# Patient Record
Sex: Female | Born: 1950 | Race: White | Hispanic: No | Marital: Single | Smoking: Never smoker
Health system: Southern US, Community
[De-identification: ages and names within clinical notes are randomized; demographics above are authoritative.]

## PROBLEM LIST (undated history)

## (undated) DIAGNOSIS — E7800 Pure hypercholesterolemia, unspecified: Secondary | ICD-10-CM

## (undated) DIAGNOSIS — M1990 Unspecified osteoarthritis, unspecified site: Secondary | ICD-10-CM

## (undated) HISTORY — PX: ABDOMINAL HYSTERECTOMY: SHX81

## (undated) HISTORY — PX: BLADDER SURGERY: SHX569

## (undated) HISTORY — PX: TONSILLECTOMY: SUR1361

## (undated) HISTORY — PX: WISDOM TOOTH EXTRACTION: SHX21

## (undated) HISTORY — PX: TUBAL LIGATION: SHX77

---

## 2015-01-01 ENCOUNTER — Emergency Department (INDEPENDENT_AMBULATORY_CARE_PROVIDER_SITE_OTHER)
Admission: EM | Admit: 2015-01-01 | Discharge: 2015-01-01 | Disposition: A | Discharge status: Home / Self Care | Payer: Medicare Other | Source: Home / Self Care | Attending: Family Medicine | Admitting: Family Medicine

## 2015-01-01 ENCOUNTER — Encounter (HOSPITAL_COMMUNITY): Payer: Self-pay | Admitting: Emergency Medicine

## 2015-01-01 DIAGNOSIS — R05 Cough: Secondary | ICD-10-CM | POA: Diagnosis not present

## 2015-01-01 DIAGNOSIS — J302 Other seasonal allergic rhinitis: Secondary | ICD-10-CM

## 2015-01-01 DIAGNOSIS — R059 Cough, unspecified: Secondary | ICD-10-CM

## 2015-01-01 MED ORDER — HYDROCODONE-HOMATROPINE 5-1.5 MG/5ML PO SYRP: 5.0000 mL | ORAL_SOLUTION | Freq: Four times a day (QID) | ORAL | Status: DC | PRN

## 2015-01-01 MED ORDER — FLUTICASONE PROPIONATE 50 MCG/ACT NA SUSP: 2.0000 | Freq: Every day | NASAL | Status: DC

## 2015-01-01 NOTE — ED Notes (Signed)
C/o cough, PND onset 2 weeks Denies fevers, chills Alert, no signs of acute distress.

## 2015-01-01 NOTE — Discharge Instructions (Signed)
Thank you for coming in today. Call or go to the emergency room if you get worse, have trouble breathing, have chest pains, or palpitations.    Allergic Rhinitis Allergic rhinitis is when the mucous membranes in the nose respond to allergens. Allergens are particles in the air that cause your body to have an allergic reaction. This causes you to release allergic antibodies. Through a chain of events, these eventually cause you to release histamine into the blood stream. Although meant to protect the body, it is this release of histamine that causes your discomfort, such as frequent sneezing, congestion, and an itchy, runny nose.  CAUSES  Seasonal allergic rhinitis (hay fever) is caused by pollen allergens that may come from grasses, trees, and weeds. Year-round allergic rhinitis (perennial allergic rhinitis) is caused by allergens such as house dust mites, pet dander, and mold spores.  SYMPTOMS   Nasal stuffiness (congestion).  Itchy, runny nose with sneezing and tearing of the eyes. DIAGNOSIS  Your health care provider can help you determine the allergen or allergens that trigger your symptoms. If you and your health care provider are unable to determine the allergen, skin or blood testing may be used. TREATMENT  Allergic rhinitis does not have a cure, but it can be controlled by:  Medicines and allergy shots (immunotherapy).  Avoiding the allergen. Hay fever may often be treated with antihistamines in pill or nasal spray forms. Antihistamines block the effects of histamine. There are over-the-counter medicines that may help with nasal congestion and swelling around the eyes. Check with your health care provider before taking or giving this medicine.  If avoiding the allergen or the medicine prescribed do not work, there are many new medicines your health care provider can prescribe. Stronger medicine may be used if initial measures are ineffective. Desensitizing injections can be used if  medicine and avoidance does not work. Desensitization is when a patient is given ongoing shots until the body becomes less sensitive to the allergen. Make sure you follow up with your health care provider if problems continue. HOME CARE INSTRUCTIONS It is not possible to completely avoid allergens, but you can reduce your symptoms by taking steps to limit your exposure to them. It helps to know exactly what you are allergic to so that you can avoid your specific triggers. SEEK MEDICAL CARE IF:   You have a fever.  You develop a cough that does not stop easily (persistent).  You have shortness of breath.  You start wheezing.  Symptoms interfere with normal daily activities. Document Released: 06/13/2001 Document Revised: 09/23/2013 Document Reviewed: 05/26/2013 [facility] Patient Information 2015 [place], [place]. This information is not intended to replace advice given to you by your health care provider. Make sure you discuss any questions you have with your health care provider.  Cough, Adult  A cough is a reflex that helps clear your throat and airways. It can help heal the body or may be a reaction to an irritated airway. A cough may only last 2 or 3 weeks (acute) or may last more than 8 weeks (chronic).  CAUSES Acute cough:  Viral or bacterial infections. Chronic cough:  Infections.  Allergies.  Asthma.  Post-nasal drip.  Smoking.  Heartburn or acid reflux.  Some medicines.  Chronic lung problems (COPD).  Cancer. SYMPTOMS   Cough.  Fever.  Chest pain.  Increased breathing rate.  High-pitched whistling sound when breathing (wheezing).  Colored mucus that you cough up (sputum). TREATMENT   A bacterial cough may  be treated with antibiotic medicine.  A viral cough must run its course and will not respond to antibiotics.  Your caregiver may recommend other treatments if you have a chronic cough. HOME CARE INSTRUCTIONS   Only take over-the-counter or  prescription medicines for pain, discomfort, or fever as directed by your caregiver. Use cough suppressants only as directed by your caregiver.  Use a cold steam vaporizer or humidifier in your bedroom or home to help loosen secretions.  Sleep in a semi-upright position if your cough is worse at night.  Rest as needed.  Stop smoking if you smoke. SEEK IMMEDIATE MEDICAL CARE IF:   You have pus in your sputum.  Your cough starts to worsen.  You cannot control your cough with suppressants and are losing sleep.  You begin coughing up blood.  You have difficulty breathing.  You develop pain which is getting worse or is uncontrolled with medicine.  You have a fever. MAKE SURE YOU:   Understand these instructions.  Will watch your condition.  Will get help right away if you are not doing well or get worse. Document Released: 03/17/2011 Document Revised: 12/11/2011 Document Reviewed: 03/17/2011 [facility] Patient Information 2015 [place]. This information is not intended to replace advice given to you by your health care provider. Make sure you discuss any questions you have with your health care provider.

## 2015-01-01 NOTE — ED Provider Notes (Signed)
Ariana Ortega is a 64 y.o. female who presents to Urgent Care today for cough and postnasal drip for 2 weeks. Patient has tried some over-the-counter cold medications which have not helped. She has not tried any allergy medicines. No fevers chills nausea vomiting or diarrhea. No chest pain palpitations or shortness of breath. No wheezing. Cough is bothersome especially at nighttime.   History reviewed. No pertinent past medical history. History reviewed. No pertinent past surgical history. History  Substance Use Topics  . Smoking status: Never Smoker   . Smokeless tobacco: Not on file  . Alcohol Use: No   ROS as above Medications: No current facility-administered medications for this encounter.   Current Outpatient Prescriptions  Medication Sig Dispense Refill  . SIMVASTATIN PO Take by mouth.    . fluticasone (FLONASE) 50 MCG/ACT nasal spray Place 2 sprays into both nostrils daily. 16 g 2  . HYDROcodone-homatropine (HYCODAN) 5-1.5 MG/5ML syrup Take 5 mLs by mouth every 6 (six) hours as needed for cough. 120 mL 0   No Known Allergies   Exam:  BP 140/86 mmHg  Pulse 84  Temp(Src) 98.2 F (36.8 C) (Oral)  Resp 18  SpO2 98% Gen: Well NAD HEENT: EOMI,  MMM inflamed nasal turbinates with clear discharge present. Posterior pharynx cobblestoning. Normal tympanic membranes bilaterally. Lungs: Normal work of breathing. CTABL Heart: RRR no MRG Abd: NABS, Soft. Nondistended, Nontender Exts: Brisk capillary refill, warm and well perfused.   No results found for this or any previous visit (from the past 24 hour(s)). No results found.  Assessment and Plan: 64 y.o. female with postnasal drip with resulting cough. Nasal drip likely due to seasonal allergies. Treat with Flonase nasal spray and Zyrtec. Treat cough with Hycodan.  Discussed warning signs or symptoms. Please see discharge instructions. Patient expresses understanding.     Rodolph BongEvan S Corey, MD 01/01/15 [phone]

## 2015-06-09 DIAGNOSIS — Z01419 Encounter for gynecological examination (general) (routine) without abnormal findings: Secondary | ICD-10-CM | POA: Diagnosis not present

## 2015-10-22 DIAGNOSIS — E7800 Pure hypercholesterolemia, unspecified: Secondary | ICD-10-CM | POA: Diagnosis not present

## 2015-10-22 DIAGNOSIS — Z0000 Encounter for general adult medical examination without abnormal findings: Secondary | ICD-10-CM | POA: Diagnosis not present

## 2015-10-22 DIAGNOSIS — Z1389 Encounter for screening for other disorder: Secondary | ICD-10-CM | POA: Diagnosis not present

## 2015-10-22 DIAGNOSIS — Z23 Encounter for immunization: Secondary | ICD-10-CM | POA: Diagnosis not present

## 2015-10-22 DIAGNOSIS — Z6831 Body mass index (BMI) 31.0-31.9, adult: Secondary | ICD-10-CM | POA: Diagnosis not present

## 2015-10-26 ENCOUNTER — Other Ambulatory Visit: Payer: Self-pay | Admitting: Internal Medicine

## 2015-10-26 DIAGNOSIS — Z0000 Encounter for general adult medical examination without abnormal findings: Secondary | ICD-10-CM

## 2015-10-26 DIAGNOSIS — Z1231 Encounter for screening mammogram for malignant neoplasm of breast: Secondary | ICD-10-CM

## 2015-11-23 DIAGNOSIS — E7800 Pure hypercholesterolemia, unspecified: Secondary | ICD-10-CM | POA: Diagnosis not present

## 2015-11-24 DIAGNOSIS — Z1212 Encounter for screening for malignant neoplasm of rectum: Secondary | ICD-10-CM | POA: Diagnosis not present

## 2016-10-23 DIAGNOSIS — R8299 Other abnormal findings in urine: Secondary | ICD-10-CM | POA: Diagnosis not present

## 2016-10-23 DIAGNOSIS — E7800 Pure hypercholesterolemia, unspecified: Secondary | ICD-10-CM | POA: Diagnosis not present

## 2016-10-26 DIAGNOSIS — E668 Other obesity: Secondary | ICD-10-CM | POA: Diagnosis not present

## 2016-10-26 DIAGNOSIS — Z0000 Encounter for general adult medical examination without abnormal findings: Secondary | ICD-10-CM | POA: Diagnosis not present

## 2016-10-26 DIAGNOSIS — Z1389 Encounter for screening for other disorder: Secondary | ICD-10-CM | POA: Diagnosis not present

## 2016-10-26 DIAGNOSIS — E7800 Pure hypercholesterolemia, unspecified: Secondary | ICD-10-CM | POA: Diagnosis not present

## 2016-10-26 DIAGNOSIS — Z6832 Body mass index (BMI) 32.0-32.9, adult: Secondary | ICD-10-CM | POA: Diagnosis not present

## 2016-10-26 DIAGNOSIS — D692 Other nonthrombocytopenic purpura: Secondary | ICD-10-CM | POA: Diagnosis not present

## 2016-10-26 DIAGNOSIS — Z23 Encounter for immunization: Secondary | ICD-10-CM | POA: Diagnosis not present

## 2016-11-06 DIAGNOSIS — Z1212 Encounter for screening for malignant neoplasm of rectum: Secondary | ICD-10-CM | POA: Diagnosis not present

## 2017-09-11 ENCOUNTER — Other Ambulatory Visit: Payer: Self-pay | Admitting: Pharmacy Technician

## 2017-09-11 NOTE — Patient Outreach (Signed)
[facility] Care Management  09/11/2017  Edwena BlowCarmen Esquilin October 28, 1950 [id number]  Incoming [employer] EMMI call in reference to medication adherence. HIPAA identifiers verified and verbal consent received. Patient states she is currently taking Rosuvastatin 10 mg daily as prescribed and she does not miss any doses or have any barriers. She did state that she was use to getting a 3 month supply but lately only gets 1 month at a time. She was not sure why she this change was made or how to go about resuming with 3 months again. At her request I contacted Dr. Deneen Hartsisovec's office and left a voicemail for Morrie SheldonAshley, RN to call in a new prescription for 3 months to [facility] in [place] if Dr. Wylene Simmerisovec approves.  Incoming call from [place], the new prescription for 3 months has been sent in to [facility]. I left a HIPAA compliant voicemail informing the patient.  Daryll Brodrystal Hatim Homann, CPhT [facility] [phone]

## 2017-10-24 DIAGNOSIS — E7800 Pure hypercholesterolemia, unspecified: Secondary | ICD-10-CM | POA: Diagnosis not present

## 2017-10-24 DIAGNOSIS — R82998 Other abnormal findings in urine: Secondary | ICD-10-CM | POA: Diagnosis not present

## 2017-10-31 DIAGNOSIS — M1712 Unilateral primary osteoarthritis, left knee: Secondary | ICD-10-CM | POA: Diagnosis not present

## 2017-10-31 DIAGNOSIS — Z1389 Encounter for screening for other disorder: Secondary | ICD-10-CM | POA: Diagnosis not present

## 2017-10-31 DIAGNOSIS — Z0000 Encounter for general adult medical examination without abnormal findings: Secondary | ICD-10-CM | POA: Diagnosis not present

## 2017-10-31 DIAGNOSIS — Z6831 Body mass index (BMI) 31.0-31.9, adult: Secondary | ICD-10-CM | POA: Diagnosis not present

## 2017-10-31 DIAGNOSIS — E7800 Pure hypercholesterolemia, unspecified: Secondary | ICD-10-CM | POA: Diagnosis not present

## 2017-10-31 DIAGNOSIS — M1711 Unilateral primary osteoarthritis, right knee: Secondary | ICD-10-CM | POA: Diagnosis not present

## 2017-10-31 DIAGNOSIS — E668 Other obesity: Secondary | ICD-10-CM | POA: Diagnosis not present

## 2017-10-31 DIAGNOSIS — D692 Other nonthrombocytopenic purpura: Secondary | ICD-10-CM | POA: Diagnosis not present

## 2017-11-01 ENCOUNTER — Other Ambulatory Visit: Payer: Self-pay | Admitting: Internal Medicine

## 2017-11-01 DIAGNOSIS — Z23 Encounter for immunization: Secondary | ICD-10-CM | POA: Diagnosis not present

## 2017-11-01 DIAGNOSIS — Z13820 Encounter for screening for osteoporosis: Secondary | ICD-10-CM | POA: Diagnosis not present

## 2017-11-01 DIAGNOSIS — Z1231 Encounter for screening mammogram for malignant neoplasm of breast: Secondary | ICD-10-CM

## 2017-11-06 DIAGNOSIS — Z1212 Encounter for screening for malignant neoplasm of rectum: Secondary | ICD-10-CM | POA: Diagnosis not present

## 2018-01-09 ENCOUNTER — Ambulatory Visit
Admission: RE | Admit: 2018-01-09 | Discharge: 2018-01-09 | Disposition: A | Discharge status: Home / Self Care | Payer: PPO | Source: Ambulatory Visit | Attending: Internal Medicine | Admitting: Internal Medicine

## 2018-01-09 DIAGNOSIS — Z1231 Encounter for screening mammogram for malignant neoplasm of breast: Secondary | ICD-10-CM

## 2018-03-22 DIAGNOSIS — N390 Urinary tract infection, site not specified: Secondary | ICD-10-CM | POA: Diagnosis not present

## 2018-03-22 DIAGNOSIS — R300 Dysuria: Secondary | ICD-10-CM | POA: Diagnosis not present

## 2018-06-18 DIAGNOSIS — M25552 Pain in left hip: Secondary | ICD-10-CM | POA: Diagnosis not present

## 2018-06-18 DIAGNOSIS — Z6831 Body mass index (BMI) 31.0-31.9, adult: Secondary | ICD-10-CM | POA: Diagnosis not present

## 2018-06-28 DIAGNOSIS — M25552 Pain in left hip: Secondary | ICD-10-CM | POA: Diagnosis not present

## 2018-06-28 DIAGNOSIS — M1612 Unilateral primary osteoarthritis, left hip: Secondary | ICD-10-CM | POA: Diagnosis not present

## 2018-08-12 NOTE — H&P (Signed)
TOTAL HIP ADMISSION H&P  Patient is admitted for left total hip arthroplasty, anterior approach.  Subjective:  Chief Complaint:    Left hip primary OA / pain  HPI: Ariana Ortega, 67 y.o. female, has a history of pain and functional disability in the left hip(s) due to arthritis and patient has failed non-surgical conservative treatments for greater than 12 weeks to include NSAID's and/or analgesics and activity modification.  Onset of symptoms was gradual starting years ago with gradually worsening course since that time.The patient noted no past surgery on the left hip(s).  Patient currently rates pain in the left hip at 10 out of 10 with activity. Patient has night pain, worsening of pain with activity and weight bearing, trendelenberg gait, pain that interfers with activities of daily living and pain with passive range of motion. Patient has evidence of periarticular osteophytes and joint space narrowing by imaging studies. This condition presents safety issues increasing the risk of falls.   There is no current active infection.  Risks, benefits and expectations were discussed with the patient.  Risks including but not limited to the risk of anesthesia, blood clots, nerve damage, blood vessel damage, failure of the prosthesis, infection and up to and including death.  Patient understand the risks, benefits and expectations and wishes to proceed with surgery.   PCP: Gaspar Garbe, MD  D/C Plans:       Home  Post-op Meds:       No Rx given  Tranexamic Acid:      To be given - IV   Decadron:      Is to be given  FYI:      ASA  Norco (ok per pt)  DME:   Rx given for - RW   PT:   No PT    Past Medical History:  Diagnosis Date  . Arthritis   . Elevated cholesterol     Past Surgical History:  Procedure Laterality Date  . ABDOMINAL HYSTERECTOMY    . BLADDER SURGERY     with mesh   . TONSILLECTOMY  age 45   . TUBAL LIGATION  50 years ago  . WISDOM TOOTH EXTRACTION      No  current facility-administered medications for this encounter.    Current Outpatient Medications  Medication Sig Dispense Refill Last Dose  . acetaminophen (TYLENOL) 500 MG tablet Take 500-1,000 mg by mouth every 6 (six) hours as needed (for pain).     Marland Kitchen aspirin EC 81 MG tablet Take 81 mg by mouth daily.     . meloxicam (MOBIC) 7.5 MG tablet Take 7.5 mg by mouth daily.     . rosuvastatin (CRESTOR) 10 MG tablet Take 10 mg by mouth daily.      Allergies  Allergen Reactions  . Codeine Nausea And Vomiting    Nausea , vomiting, dizziness      Social History   Tobacco Use  . Smoking status: Never Smoker  . Smokeless tobacco: Never Used  Substance Use Topics  . Alcohol use: Yes    Comment: occ       Review of Systems  Constitutional: Negative.   HENT: Negative.   Eyes: Negative.   Respiratory: Negative.   Cardiovascular: Negative.   Gastrointestinal: Negative.   Genitourinary: Negative.   Musculoskeletal: Positive for joint pain.  Skin: Negative.   Neurological: Negative.   Endo/Heme/Allergies: Negative.   Psychiatric/Behavioral: Negative.     Objective:  Physical Exam  Constitutional: She is oriented to person,  place, and time. She appears well-developed.  HENT:  Head: Normocephalic.  Eyes: Pupils are equal, round, and reactive to light.  Neck: Neck supple. No JVD present. No tracheal deviation present. No thyromegaly present.  Cardiovascular: Normal rate, regular rhythm and intact distal pulses.  Respiratory: Effort normal and breath sounds normal. No respiratory distress. She has no wheezes.  GI: Soft. There is no tenderness. There is no guarding.  Musculoskeletal:       Left hip: She exhibits decreased range of motion, decreased strength, tenderness and bony tenderness. She exhibits no swelling, no deformity and no laceration.  Lymphadenopathy:    She has no cervical adenopathy.  Neurological: She is alert and oriented to person, place, and time.  Skin: Skin is  warm and dry.  Psychiatric: She has a normal mood and affect.      Imaging Review Plain radiographs demonstrate severe degenerative joint disease of the left hip. The bone quality appears to be good for age and reported activity level.    Preoperative templating of the joint replacement has been completed, documented, and submitted to the Operating Room personnel in order to optimize intra-operative equipment management.     Assessment/Plan:  End stage arthritis, left hip  The patient history, physical examination, clinical judgement of the provider and imaging studies are consistent with end stage degenerative joint disease of the left hip and total hip arthroplasty is deemed medically necessary. The treatment options including medical management, injection therapy, arthroscopy and arthroplasty were discussed at length. The risks and benefits of total hip arthroplasty were presented and reviewed. The risks due to aseptic loosening, infection, stiffness, dislocation/subluxation,  thromboembolic complications and other imponderables were discussed.  The patient acknowledged the explanation, agreed to proceed with the plan and consent was signed. Patient is being admitted for inpatient treatment for surgery, pain control, PT, OT, prophylactic antibiotics, VTE prophylaxis, progressive ambulation and ADL's and discharge planning.The patient is planning to be discharged home.     Ariana Ortega Ariana Easton   PA-C  09/02/2018, 7:49 AM

## 2018-08-14 DIAGNOSIS — M1612 Unilateral primary osteoarthritis, left hip: Secondary | ICD-10-CM | POA: Diagnosis not present

## 2018-08-27 NOTE — Patient Instructions (Signed)
Ariana BillingsCarmen E Ortega  08/27/2018   Your procedure is scheduled on: 09-03-18   Report [facility]    Report to admitting at 9:00AM    Call this number if you have problems the morning of surgery [phone]     Remember: Do not eat food or drink liquids :After Midnight. BRUSH YOUR TEETH MORNING OF SURGERY AND RINSE YOUR MOUTH OUT, NO CHEWING GUM CANDY OR MINTS.      Take these medicines the morning of surgery with A SIP OF WATER: rosuvastatin, tylenol if needed                                You may not have any metal on your body including hair pins and              piercings  Do not wear jewelry, make-up, lotions, powders or perfumes, deodorant             Do not wear nail polish.  Do not shave  48 hours prior to surgery.     Do not bring valuables to the hospital. [facility] IS NOT             RESPONSIBLE   FOR VALUABLES.  Contacts, dentures or bridgework may not be worn into surgery.  Leave suitcase in the car. After surgery it may be brought to your room.                   Please read over the following fact sheets you were given: _____________________________________________________________________             [facility] - Preparing for Surgery Before surgery, you can play an important role.  Because skin is not sterile, your skin needs to be as free of germs as possible.  You can reduce the number of germs on your skin by washing with CHG (chlorahexidine gluconate) soap before surgery.  CHG is an antiseptic cleaner which kills germs and bonds with the skin to continue killing germs even after washing. Please DO NOT use if you have an allergy to CHG or antibacterial soaps.  If your skin becomes reddened/irritated stop using the CHG and inform your nurse when you arrive at Short Stay. Do not shave (including legs and underarms) for at least 48 hours prior to the first CHG shower.  You may shave your face/neck. Please follow these  instructions carefully:  1.  Shower with CHG Soap the night before surgery and the  morning of Surgery.  2.  If you choose to wash your hair, wash your hair first as usual with your  normal  shampoo.  3.  After you shampoo, rinse your hair and body thoroughly to remove the  shampoo.                           4.  Use CHG as you would any other liquid soap.  You can apply chg directly  to the skin and wash                       Gently with a scrungie or clean washcloth.  5.  Apply the CHG Soap to your body ONLY FROM THE NECK DOWN.   Do not use on face/ open  Wound or open sores. Avoid contact with eyes, ears mouth and genitals (private parts).                       Wash face,  Genitals (private parts) with your normal soap.             6.  Wash thoroughly, paying special attention to the area where your surgery  will be performed.  7.  Thoroughly rinse your body with warm water from the neck down.  8.  DO NOT shower/wash with your normal soap after using and rinsing off  the CHG Soap.                9.  Pat yourself dry with a clean towel.            10.  Wear clean pajamas.            11.  Place clean sheets on your bed the night of your first shower and do not  sleep with pets. Day of Surgery : Do not apply any lotions/deodorants the morning of surgery.  Please wear clean clothes to the hospital/surgery center.  FAILURE TO FOLLOW THESE INSTRUCTIONS MAY RESULT IN THE CANCELLATION OF YOUR SURGERY PATIENT SIGNATURE_________________________________  NURSE SIGNATURE__________________________________  ________________________________________________________________________   Adam Phenix  An incentive spirometer is a tool that can help keep your lungs clear and active. This tool measures how well you are filling your lungs with each breath. Taking long deep breaths may help reverse or decrease the chance of developing breathing (pulmonary) problems (especially  infection) following:  A long period of time when you are unable to move or be active. BEFORE THE PROCEDURE   If the spirometer includes an indicator to show your best effort, your nurse or respiratory therapist will set it to a desired goal.  If possible, sit up straight or lean slightly forward. Try not to slouch.  Hold the incentive spirometer in an upright position. INSTRUCTIONS FOR USE  1. Sit on the edge of your bed if possible, or sit up as far as you can in bed or on a chair. 2. Hold the incentive spirometer in an upright position. 3. Breathe out normally. 4. Place the mouthpiece in your mouth and seal your lips tightly around it. 5. Breathe in slowly and as deeply as possible, raising the piston or the ball toward the top of the column. 6. Hold your breath for 3-5 seconds or for as long as possible. Allow the piston or ball to fall to the bottom of the column. 7. Remove the mouthpiece from your mouth and breathe out normally. 8. Rest for a few seconds and repeat Steps 1 through 7 at least 10 times every 1-2 hours when you are awake. Take your time and take a few normal breaths between deep breaths. 9. The spirometer may include an indicator to show your best effort. Use the indicator as a goal to work toward during each repetition. 10. After each set of 10 deep breaths, practice coughing to be sure your lungs are clear. If you have an incision (the cut made at the time of surgery), support your incision when coughing by placing a pillow or rolled up towels firmly against it. Once you are able to get out of bed, walk around indoors and cough well. You may stop using the incentive spirometer when instructed by your caregiver.  RISKS AND COMPLICATIONS  Take your time so you do not get  dizzy or light-headed.  If you are in pain, you may need to take or ask for pain medication before doing incentive spirometry. It is harder to take a deep breath if you are having pain. AFTER  USE  Rest and breathe slowly and easily.  It can be helpful to keep track of a log of your progress. Your caregiver can provide you with a simple table to help with this. If you are using the spirometer at home, follow these instructions: [place] IF:   You are having difficultly using the spirometer.  You have trouble using the spirometer as often as instructed.  Your pain medication is not giving enough relief while using the spirometer.  You develop fever of 100.5 F (38.1 C) or higher. SEEK IMMEDIATE MEDICAL CARE IF:   You cough up bloody sputum that had not been present before.  You develop fever of 102 F (38.9 C) or greater.  You develop worsening pain at or near the incision site. MAKE SURE YOU:   Understand these instructions.  Will watch your condition.  Will get help right away if you are not doing well or get worse. Document Released: 01/29/2007 Document Revised: 12/11/2011 Document Reviewed: 04/01/2007 ExitCare Patient Information 2014 ExitCare, [place].   ________________________________________________________________________  WHAT IS A BLOOD TRANSFUSION? Blood Transfusion Information  A transfusion is the replacement of blood or some of its parts. Blood is made up of multiple cells which provide different functions.  Red blood cells carry oxygen and are used for blood loss replacement.  White blood cells fight against infection.  Platelets control bleeding.  Plasma helps clot blood.  Other blood products are available for specialized needs, such as hemophilia or other clotting disorders. BEFORE THE TRANSFUSION  Who gives blood for transfusions?   Healthy volunteers who are fully evaluated to make sure their blood is safe. This is blood bank blood. Transfusion therapy is the safest it has ever been in the practice of medicine. Before blood is taken from a donor, a complete history is taken to make sure that person has no history of diseases  nor engages in risky social behavior (examples are intravenous drug use or sexual activity with multiple partners). The donor's travel history is screened to minimize risk of transmitting infections, such as malaria. The donated blood is tested for signs of infectious diseases, such as HIV and hepatitis. The blood is then tested to be sure it is compatible with you in order to minimize the chance of a transfusion reaction. If you or a relative donates blood, this is often done in anticipation of surgery and is not appropriate for emergency situations. It takes many days to process the donated blood. RISKS AND COMPLICATIONS Although transfusion therapy is very safe and saves many lives, the main dangers of transfusion include:   Getting an infectious disease.  Developing a transfusion reaction. This is an allergic reaction to something in the blood you were given. Every precaution is taken to prevent this. The decision to have a blood transfusion has been considered carefully by your caregiver before blood is given. Blood is not given unless the benefits outweigh the risks. AFTER THE TRANSFUSION  Right after receiving a blood transfusion, you will usually feel much better and more energetic. This is especially true if your red blood cells have gotten low (anemic). The transfusion raises the level of the red blood cells which carry oxygen, and this usually causes an energy increase.  The nurse administering the transfusion will  monitor you carefully for complications. HOME CARE INSTRUCTIONS  No special instructions are needed after a transfusion. You may find your energy is better. Speak with your caregiver about any limitations on activity for underlying diseases you may have. SEEK MEDICAL CARE IF:   Your condition is not improving after your transfusion.  You develop redness or irritation at the intravenous (IV) site. SEEK IMMEDIATE MEDICAL CARE IF:  Any of the following symptoms occur over the  next 12 hours:  Shaking chills.  You have a temperature by mouth above 102 F (38.9 C), not controlled by medicine.  Chest, back, or muscle pain.  People around you feel you are not acting correctly or are confused.  Shortness of breath or difficulty breathing.  Dizziness and fainting.  You get a rash or develop hives.  You have a decrease in urine output.  Your urine turns a dark color or changes to pink, red, or brown. Any of the following symptoms occur over the next 10 days:  You have a temperature by mouth above 102 F (38.9 C), not controlled by medicine.  Shortness of breath.  Weakness after normal activity.  The white part of the eye turns yellow (jaundice).  You have a decrease in the amount of urine or are urinating less often.  Your urine turns a dark color or changes to pink, red, or brown. Document Released: 09/15/2000 Document Revised: 12/11/2011 Document Reviewed: 05/04/2008 [facility] Patient Information 2014 [place], [place].  _______________________________________________________________________

## 2018-08-28 ENCOUNTER — Encounter (HOSPITAL_COMMUNITY): Payer: Self-pay

## 2018-08-28 ENCOUNTER — Other Ambulatory Visit: Payer: Self-pay

## 2018-08-28 ENCOUNTER — Encounter (HOSPITAL_COMMUNITY)
Admission: RE | Admit: 2018-08-28 | Discharge: 2018-08-28 | Disposition: A | Discharge status: Home / Self Care | Payer: PPO | Source: Ambulatory Visit | Attending: Orthopedic Surgery | Admitting: Orthopedic Surgery

## 2018-08-28 DIAGNOSIS — Z01818 Encounter for other preprocedural examination: Secondary | ICD-10-CM | POA: Insufficient documentation

## 2018-08-28 DIAGNOSIS — M1612 Unilateral primary osteoarthritis, left hip: Secondary | ICD-10-CM | POA: Insufficient documentation

## 2018-08-28 HISTORY — DX: Pure hypercholesterolemia, unspecified: E78.00

## 2018-08-28 HISTORY — DX: Unspecified osteoarthritis, unspecified site: M19.90

## 2018-08-28 LAB — CBC
HEMATOCRIT: 41.5 % (ref 36.0–46.0)
Hemoglobin: 13.5 g/dL (ref 12.0–15.0)
MCH: 31.8 pg (ref 26.0–34.0)
MCHC: 32.5 g/dL (ref 30.0–36.0)
MCV: 97.9 fL (ref 80.0–100.0)
NRBC: 0 % (ref 0.0–0.2)
Platelets: 291 10*3/uL (ref 150–400)
RBC: 4.24 MIL/uL (ref 3.87–5.11)
RDW: 12.6 % (ref 11.5–15.5)
WBC: 7.8 10*3/uL (ref 4.0–10.5)

## 2018-08-28 LAB — SURGICAL PCR SCREEN
MRSA, PCR: NEGATIVE
STAPHYLOCOCCUS AUREUS: NEGATIVE

## 2018-08-28 LAB — ABO/RH: ABO/RH(D): O POS

## 2018-09-03 ENCOUNTER — Encounter (HOSPITAL_COMMUNITY): Payer: Self-pay | Admitting: *Deleted

## 2018-09-03 ENCOUNTER — Inpatient Hospital Stay (HOSPITAL_COMMUNITY): Payer: PPO

## 2018-09-03 ENCOUNTER — Inpatient Hospital Stay (HOSPITAL_COMMUNITY): Payer: PPO | Admitting: Anesthesiology

## 2018-09-03 ENCOUNTER — Inpatient Hospital Stay (HOSPITAL_COMMUNITY)
Admission: RE | Admit: 2018-09-03 | Discharge: 2018-09-04 | DRG: 470 | Disposition: A | Discharge status: Home / Self Care | Payer: PPO | Attending: Orthopedic Surgery | Admitting: Orthopedic Surgery

## 2018-09-03 ENCOUNTER — Encounter (HOSPITAL_COMMUNITY)
Admission: RE | Disposition: A | Discharge status: Home / Self Care | Payer: Self-pay | Source: Home / Self Care | Attending: Orthopedic Surgery

## 2018-09-03 ENCOUNTER — Other Ambulatory Visit: Payer: Self-pay

## 2018-09-03 DIAGNOSIS — E663 Overweight: Secondary | ICD-10-CM | POA: Diagnosis present

## 2018-09-03 DIAGNOSIS — Z419 Encounter for procedure for purposes other than remedying health state, unspecified: Secondary | ICD-10-CM

## 2018-09-03 DIAGNOSIS — Z90710 Acquired absence of both cervix and uterus: Secondary | ICD-10-CM

## 2018-09-03 DIAGNOSIS — E7800 Pure hypercholesterolemia, unspecified: Secondary | ICD-10-CM | POA: Diagnosis not present

## 2018-09-03 DIAGNOSIS — M1612 Unilateral primary osteoarthritis, left hip: Secondary | ICD-10-CM | POA: Diagnosis not present

## 2018-09-03 DIAGNOSIS — Z471 Aftercare following joint replacement surgery: Secondary | ICD-10-CM | POA: Diagnosis not present

## 2018-09-03 DIAGNOSIS — Z96642 Presence of left artificial hip joint: Secondary | ICD-10-CM

## 2018-09-03 DIAGNOSIS — Z7982 Long term (current) use of aspirin: Secondary | ICD-10-CM

## 2018-09-03 DIAGNOSIS — Z79899 Other long term (current) drug therapy: Secondary | ICD-10-CM | POA: Diagnosis not present

## 2018-09-03 DIAGNOSIS — Z885 Allergy status to narcotic agent status: Secondary | ICD-10-CM

## 2018-09-03 DIAGNOSIS — Z96649 Presence of unspecified artificial hip joint: Secondary | ICD-10-CM

## 2018-09-03 HISTORY — PX: TOTAL HIP ARTHROPLASTY: SHX124

## 2018-09-03 LAB — TYPE AND SCREEN
ABO/RH(D): O POS
ANTIBODY SCREEN: NEGATIVE

## 2018-09-03 SURGERY — ARTHROPLASTY, HIP, TOTAL, ANTERIOR APPROACH
Anesthesia: Spinal | Site: Hip | Laterality: Left

## 2018-09-03 MED ORDER — DEXAMETHASONE SODIUM PHOSPHATE 10 MG/ML IJ SOLN: 10.0000 mg | Freq: Once | INTRAMUSCULAR | Status: DC

## 2018-09-03 MED ORDER — HYDROCODONE-ACETAMINOPHEN 7.5-325 MG PO TABS
1.0000 | ORAL_TABLET | ORAL | Status: DC | PRN
  Administered 2018-09-03 (×2): 1 via ORAL
  Administered 2018-09-03: 2 via ORAL
  Administered 2018-09-03: 1 via ORAL
  Filled 2018-09-03 (×2): qty 1
  Filled 2018-09-03: qty 2
  Filled 2018-09-03: qty 1

## 2018-09-03 MED ORDER — PHENYLEPHRINE 40 MCG/ML (10ML) SYRINGE FOR IV PUSH (FOR BLOOD PRESSURE SUPPORT)
PREFILLED_SYRINGE | INTRAVENOUS | Status: AC
  Filled 2018-09-03: qty 10

## 2018-09-03 MED ORDER — FERROUS SULFATE 325 (65 FE) MG PO TABS
325.0000 mg | ORAL_TABLET | Freq: Three times a day (TID) | ORAL | Status: DC
  Filled 2018-09-03: qty 1

## 2018-09-03 MED ORDER — MAGNESIUM CITRATE PO SOLN: 1.0000 | Freq: Once | ORAL | Status: DC | PRN

## 2018-09-03 MED ORDER — TRANEXAMIC ACID-NACL 1000-0.7 MG/100ML-% IV SOLN
1000.0000 mg | INTRAVENOUS | Status: AC
  Administered 2018-09-03: 1000 mg via INTRAVENOUS
  Filled 2018-09-03: qty 100

## 2018-09-03 MED ORDER — PROPOFOL 10 MG/ML IV BOLUS
INTRAVENOUS | Status: AC
  Filled 2018-09-03: qty 60

## 2018-09-03 MED ORDER — METHOCARBAMOL 500 MG PO TABS
500.0000 mg | ORAL_TABLET | Freq: Four times a day (QID) | ORAL | Status: DC | PRN
  Administered 2018-09-03: 500 mg via ORAL
  Filled 2018-09-03: qty 1

## 2018-09-03 MED ORDER — HYDROCODONE-ACETAMINOPHEN 7.5-325 MG PO TABS: 1.0000 | ORAL_TABLET | ORAL | 0 refills | Status: AC | PRN

## 2018-09-03 MED ORDER — ASPIRIN 81 MG PO CHEW: 81.0000 mg | CHEWABLE_TABLET | Freq: Two times a day (BID) | ORAL | 0 refills | Status: AC

## 2018-09-03 MED ORDER — ONDANSETRON HCL 4 MG/2ML IJ SOLN
INTRAMUSCULAR | Status: AC
  Filled 2018-09-03: qty 2

## 2018-09-03 MED ORDER — HYDROMORPHONE HCL 1 MG/ML IJ SOLN: 0.5000 mg | INTRAMUSCULAR | Status: DC | PRN

## 2018-09-03 MED ORDER — BISACODYL 10 MG RE SUPP: 10.0000 mg | Freq: Every day | RECTAL | Status: DC | PRN

## 2018-09-03 MED ORDER — DIPHENHYDRAMINE HCL 12.5 MG/5ML PO ELIX: 12.5000 mg | ORAL_SOLUTION | ORAL | Status: DC | PRN

## 2018-09-03 MED ORDER — CELECOXIB 200 MG PO CAPS
200.0000 mg | ORAL_CAPSULE | Freq: Two times a day (BID) | ORAL | Status: DC
  Administered 2018-09-03 – 2018-09-04 (×3): 200 mg via ORAL
  Filled 2018-09-03 (×3): qty 1

## 2018-09-03 MED ORDER — CEFAZOLIN SODIUM-DEXTROSE 2-4 GM/100ML-% IV SOLN
2.0000 g | INTRAVENOUS | Status: AC
  Administered 2018-09-03: 2 g via INTRAVENOUS
  Filled 2018-09-03: qty 100

## 2018-09-03 MED ORDER — HYDROMORPHONE HCL 1 MG/ML IJ SOLN: 0.2500 mg | INTRAMUSCULAR | Status: DC | PRN

## 2018-09-03 MED ORDER — PHENOL 1.4 % MT LIQD: 1.0000 | OROMUCOSAL | Status: DC | PRN

## 2018-09-03 MED ORDER — METHOCARBAMOL 500 MG PO TABS: 500.0000 mg | ORAL_TABLET | Freq: Four times a day (QID) | ORAL | 0 refills | Status: DC | PRN

## 2018-09-03 MED ORDER — DOCUSATE SODIUM 100 MG PO CAPS
100.0000 mg | ORAL_CAPSULE | Freq: Two times a day (BID) | ORAL | Status: DC
  Filled 2018-09-03: qty 1

## 2018-09-03 MED ORDER — LACTATED RINGERS IV SOLN
INTRAVENOUS | Status: DC
  Administered 2018-09-03 (×2): via INTRAVENOUS

## 2018-09-03 MED ORDER — DOCUSATE SODIUM 100 MG PO CAPS: 100.0000 mg | ORAL_CAPSULE | Freq: Two times a day (BID) | ORAL | 0 refills | Status: AC

## 2018-09-03 MED ORDER — DEXAMETHASONE SODIUM PHOSPHATE 10 MG/ML IJ SOLN
INTRAMUSCULAR | Status: DC | PRN
  Administered 2018-09-03: 10 mg via INTRAVENOUS

## 2018-09-03 MED ORDER — PHENYLEPHRINE HCL 10 MG/ML IJ SOLN
INTRAMUSCULAR | Status: AC
  Filled 2018-09-03: qty 1

## 2018-09-03 MED ORDER — DEXAMETHASONE SODIUM PHOSPHATE 10 MG/ML IJ SOLN
INTRAMUSCULAR | Status: AC
  Filled 2018-09-03: qty 1

## 2018-09-03 MED ORDER — TRANEXAMIC ACID-NACL 1000-0.7 MG/100ML-% IV SOLN
1000.0000 mg | Freq: Once | INTRAVENOUS | Status: AC
  Administered 2018-09-03: 1000 mg via INTRAVENOUS
  Filled 2018-09-03: qty 100

## 2018-09-03 MED ORDER — ONDANSETRON HCL 4 MG/2ML IJ SOLN
INTRAMUSCULAR | Status: DC | PRN
  Administered 2018-09-03: 4 mg via INTRAVENOUS

## 2018-09-03 MED ORDER — ONDANSETRON HCL 4 MG PO TABS: 4.0000 mg | ORAL_TABLET | Freq: Four times a day (QID) | ORAL | Status: DC | PRN

## 2018-09-03 MED ORDER — ONDANSETRON HCL 4 MG/2ML IJ SOLN: 4.0000 mg | Freq: Four times a day (QID) | INTRAMUSCULAR | Status: DC | PRN

## 2018-09-03 MED ORDER — PROMETHAZINE HCL 25 MG/ML IJ SOLN: 6.2500 mg | INTRAMUSCULAR | Status: DC | PRN

## 2018-09-03 MED ORDER — MENTHOL 3 MG MT LOZG: 1.0000 | LOZENGE | OROMUCOSAL | Status: DC | PRN

## 2018-09-03 MED ORDER — MIDAZOLAM HCL 2 MG/2ML IJ SOLN
INTRAMUSCULAR | Status: AC
  Filled 2018-09-03: qty 2

## 2018-09-03 MED ORDER — ROSUVASTATIN CALCIUM 10 MG PO TABS: 10.0000 mg | ORAL_TABLET | Freq: Every day | ORAL | Status: DC

## 2018-09-03 MED ORDER — ALUM & MAG HYDROXIDE-SIMETH 200-200-20 MG/5ML PO SUSP: 15.0000 mL | ORAL | Status: DC | PRN

## 2018-09-03 MED ORDER — CEFAZOLIN SODIUM-DEXTROSE 2-4 GM/100ML-% IV SOLN
2.0000 g | Freq: Four times a day (QID) | INTRAVENOUS | Status: AC
  Administered 2018-09-03 (×2): 2 g via INTRAVENOUS
  Filled 2018-09-03 (×2): qty 100

## 2018-09-03 MED ORDER — METHOCARBAMOL 500 MG IVPB - SIMPLE MED
500.0000 mg | Freq: Four times a day (QID) | INTRAVENOUS | Status: DC | PRN
  Filled 2018-09-03: qty 50

## 2018-09-03 MED ORDER — CHLORHEXIDINE GLUCONATE 4 % EX LIQD: 60.0000 mL | Freq: Once | CUTANEOUS | Status: DC

## 2018-09-03 MED ORDER — SODIUM CHLORIDE 0.9 % IR SOLN
Status: DC | PRN
  Administered 2018-09-03: 1000 mL

## 2018-09-03 MED ORDER — POLYETHYLENE GLYCOL 3350 17 G PO PACK: 17.0000 g | PACK | Freq: Two times a day (BID) | ORAL | 0 refills | Status: AC

## 2018-09-03 MED ORDER — BUPIVACAINE IN DEXTROSE 0.75-8.25 % IT SOLN
INTRATHECAL | Status: DC | PRN
  Administered 2018-09-03: 1.8 mL via INTRATHECAL

## 2018-09-03 MED ORDER — SODIUM CHLORIDE 0.9 % IV SOLN
INTRAVENOUS | Status: DC
  Administered 2018-09-03 – 2018-09-04 (×2): via INTRAVENOUS

## 2018-09-03 MED ORDER — POLYETHYLENE GLYCOL 3350 17 G PO PACK
17.0000 g | PACK | Freq: Two times a day (BID) | ORAL | Status: DC
  Filled 2018-09-03 (×2): qty 1

## 2018-09-03 MED ORDER — STERILE WATER FOR IRRIGATION IR SOLN
Status: DC | PRN
  Administered 2018-09-03: 2000 mL

## 2018-09-03 MED ORDER — DEXAMETHASONE SODIUM PHOSPHATE 10 MG/ML IJ SOLN
10.0000 mg | Freq: Once | INTRAMUSCULAR | Status: AC
  Administered 2018-09-04: 10 mg via INTRAVENOUS
  Filled 2018-09-03: qty 1

## 2018-09-03 MED ORDER — PHENYLEPHRINE 40 MCG/ML (10ML) SYRINGE FOR IV PUSH (FOR BLOOD PRESSURE SUPPORT)
PREFILLED_SYRINGE | INTRAVENOUS | Status: DC | PRN
  Administered 2018-09-03 (×2): 80 ug via INTRAVENOUS

## 2018-09-03 MED ORDER — FERROUS SULFATE 325 (65 FE) MG PO TABS: 325.0000 mg | ORAL_TABLET | Freq: Three times a day (TID) | ORAL | 3 refills | Status: AC

## 2018-09-03 MED ORDER — MIDAZOLAM HCL 5 MG/5ML IJ SOLN
INTRAMUSCULAR | Status: DC | PRN
  Administered 2018-09-03: 2 mg via INTRAVENOUS

## 2018-09-03 MED ORDER — ASPIRIN 81 MG PO CHEW
81.0000 mg | CHEWABLE_TABLET | Freq: Two times a day (BID) | ORAL | Status: DC
  Administered 2018-09-03 – 2018-09-04 (×2): 81 mg via ORAL
  Filled 2018-09-03 (×2): qty 1

## 2018-09-03 MED ORDER — PROPOFOL 500 MG/50ML IV EMUL
INTRAVENOUS | Status: DC | PRN
  Administered 2018-09-03: 75 ug/kg/min via INTRAVENOUS

## 2018-09-03 MED ORDER — METOCLOPRAMIDE HCL 5 MG/ML IJ SOLN: 5.0000 mg | Freq: Three times a day (TID) | INTRAMUSCULAR | Status: DC | PRN

## 2018-09-03 MED ORDER — FENTANYL CITRATE (PF) 100 MCG/2ML IJ SOLN
INTRAMUSCULAR | Status: AC
  Filled 2018-09-03: qty 2

## 2018-09-03 MED ORDER — METOCLOPRAMIDE HCL 5 MG PO TABS: 5.0000 mg | ORAL_TABLET | Freq: Three times a day (TID) | ORAL | Status: DC | PRN

## 2018-09-03 MED ORDER — FENTANYL CITRATE (PF) 100 MCG/2ML IJ SOLN
INTRAMUSCULAR | Status: DC | PRN
  Administered 2018-09-03: 100 ug via INTRAVENOUS

## 2018-09-03 MED ORDER — SODIUM CHLORIDE 0.9 % IV SOLN
INTRAVENOUS | Status: DC | PRN
  Administered 2018-09-03: 40 ug/min via INTRAVENOUS

## 2018-09-03 MED ORDER — ACETAMINOPHEN 325 MG PO TABS: 325.0000 mg | ORAL_TABLET | Freq: Four times a day (QID) | ORAL | Status: DC | PRN

## 2018-09-03 MED ORDER — HYDROCODONE-ACETAMINOPHEN 5-325 MG PO TABS: 1.0000 | ORAL_TABLET | ORAL | Status: DC | PRN

## 2018-09-03 SURGICAL SUPPLY — 43 items
BAG DECANTER FOR FLEXI CONT (MISCELLANEOUS) IMPLANT
BAG ZIPLOCK 12X15 (MISCELLANEOUS) IMPLANT
BLADE SAG 18X100X1.27 (BLADE) ×3 IMPLANT
COVER PERINEAL POST (MISCELLANEOUS) ×3 IMPLANT
COVER SURGICAL LIGHT HANDLE (MISCELLANEOUS) ×3 IMPLANT
COVER WAND RF STERILE (DRAPES) IMPLANT
CUP ACET PINNACLE SECTR 50MM (Hips) ×1 IMPLANT
DERMABOND ADVANCED (GAUZE/BANDAGES/DRESSINGS) ×2
DERMABOND ADVANCED .7 DNX12 (GAUZE/BANDAGES/DRESSINGS) ×1 IMPLANT
DRAPE STERI IOBAN 125X83 (DRAPES) ×3 IMPLANT
DRAPE U-SHAPE 47X51 STRL (DRAPES) ×6 IMPLANT
DRESSING AQUACEL AG SP 3.5X10 (GAUZE/BANDAGES/DRESSINGS) ×1 IMPLANT
DRSG AQUACEL AG ADV 3.5X10 (GAUZE/BANDAGES/DRESSINGS) ×3 IMPLANT
DRSG AQUACEL AG SP 3.5X10 (GAUZE/BANDAGES/DRESSINGS) ×3
DURAPREP 26ML APPLICATOR (WOUND CARE) ×3 IMPLANT
ELECT REM PT RETURN 15FT ADLT (MISCELLANEOUS) ×3 IMPLANT
ELIMINATOR HOLE APEX DEPUY (Hips) ×3 IMPLANT
GLOVE BIOGEL M STRL SZ7.5 (GLOVE) IMPLANT
GLOVE BIOGEL PI IND STRL 7.5 (GLOVE) ×1 IMPLANT
GLOVE BIOGEL PI IND STRL 8.5 (GLOVE) ×1 IMPLANT
GLOVE BIOGEL PI INDICATOR 7.5 (GLOVE) ×2
GLOVE BIOGEL PI INDICATOR 8.5 (GLOVE) ×2
GLOVE ECLIPSE 8.0 STRL XLNG CF (GLOVE) ×6 IMPLANT
GLOVE ORTHO TXT STRL SZ7.5 (GLOVE) ×3 IMPLANT
GOWN STRL REUS W/TWL 2XL LVL3 (GOWN DISPOSABLE) ×3 IMPLANT
GOWN STRL REUS W/TWL LRG LVL3 (GOWN DISPOSABLE) ×3 IMPLANT
HEAD FEMORAL 32 CERAMIC (Hips) ×3 IMPLANT
HOLDER FOLEY CATH W/STRAP (MISCELLANEOUS) ×3 IMPLANT
LINER ACET PNNCL PLUS4 NEUTRAL (Hips) ×1 IMPLANT
PACK ANTERIOR HIP CUSTOM (KITS) ×3 IMPLANT
PINNACLE PLUS 4 NEUTRAL (Hips) ×3 IMPLANT
PINNACLE SECTOR CUP 50MM (Hips) ×3 IMPLANT
SCREW 6.5MMX25MM (Screw) ×3 IMPLANT
STEM FEM ACTIS HIGH SZ2 (Stem) ×3 IMPLANT
SUT MNCRL AB 4-0 PS2 18 (SUTURE) ×3 IMPLANT
SUT STRATAFIX 0 PDS 27 VIOLET (SUTURE) ×3
SUT VIC AB 1 CT1 36 (SUTURE) ×9 IMPLANT
SUT VIC AB 2-0 CT1 27 (SUTURE) ×4
SUT VIC AB 2-0 CT1 TAPERPNT 27 (SUTURE) ×2 IMPLANT
SUTURE STRATFX 0 PDS 27 VIOLET (SUTURE) ×1 IMPLANT
TRAY FOLEY MTR SLVR 16FR STAT (SET/KITS/TRAYS/PACK) IMPLANT
WATER STERILE IRR 1000ML POUR (IV SOLUTION) ×3 IMPLANT
YANKAUER SUCT BULB TIP 10FT TU (MISCELLANEOUS) IMPLANT

## 2018-09-03 NOTE — Anesthesia Postprocedure Evaluation (Signed)
Anesthesia Post Note  Patient: Claiborne BillingsCarmen E Baumert  Procedure(s) Performed: LEFT TOTAL HIP ARTHROPLASTY ANTERIOR APPROACH (Left Hip)     Patient location during evaluation: PACU Anesthesia Type: Spinal Level of consciousness: oriented and awake and alert Pain management: pain level controlled Vital Signs Assessment: post-procedure vital signs reviewed and stable Respiratory status: spontaneous breathing, respiratory function stable and patient connected to nasal cannula oxygen Cardiovascular status: blood pressure returned to baseline and stable Postop Assessment: no headache, no backache and no apparent nausea or vomiting Anesthetic complications: no    Last Vitals:  Vitals:   09/03/18 1428 09/03/18 1527  BP: 119/72 132/82  Pulse: 70 72  Resp: 15 16  Temp: (!) 36.4 C (!) 36.4 C  SpO2: 100% 100%    Last Pain:  Vitals:   09/03/18 1527  TempSrc: Oral  PainSc:                  Jannae Fagerstrom S

## 2018-09-03 NOTE — Transfer of Care (Signed)
Immediate Anesthesia Transfer of Care Note  Patient: Ariana BillingsCarmen E Gonet  Procedure(s) Performed: LEFT TOTAL HIP ARTHROPLASTY ANTERIOR APPROACH (Left Hip)  Patient Location: PACU  Anesthesia Type:Spinal  Level of Consciousness: awake  Airway & Oxygen Therapy: Patient Spontanous Breathing and Patient connected to nasal cannula oxygen  Post-op Assessment: Report given to RN and Post -op Vital signs reviewed and stable  Post vital signs: Reviewed and stable  Last Vitals:  Vitals Value Taken Time  BP    Temp    Pulse 81 09/03/2018  1:20 PM  Resp 15 09/03/2018  1:20 PM  SpO2 98 % 09/03/2018  1:20 PM    Last Pain:  Vitals:   09/03/18 0923  TempSrc:   PainSc: 0-No pain         Complications: No apparent anesthesia complications

## 2018-09-03 NOTE — Anesthesia Preprocedure Evaluation (Signed)
Anesthesia Evaluation  Patient identified by MRN, date of birth, ID band Patient awake    Reviewed: Allergy & Precautions, NPO status , Patient's Chart, lab work & pertinent test results  Airway Mallampati: II  TM Distance: >3 FB Neck ROM: Full    Dental no notable dental hx.    Pulmonary neg pulmonary ROS,    Pulmonary exam normal breath sounds clear to auscultation       Cardiovascular negative cardio ROS Normal cardiovascular exam Rhythm:Regular Rate:Normal     Neuro/Psych negative neurological ROS  negative psych ROS   GI/Hepatic negative GI ROS, Neg liver ROS,   Endo/Other  negative endocrine ROS  Renal/GU negative Renal ROS  negative genitourinary   Musculoskeletal  (+) Arthritis , Osteoarthritis,    Abdominal   Peds negative pediatric ROS (+)  Hematology negative hematology ROS (+)   Anesthesia Other Findings   Reproductive/Obstetrics negative OB ROS                             Anesthesia Physical Anesthesia Plan  ASA: II  Anesthesia Plan: Spinal   Post-op Pain Management:    Induction: Intravenous  PONV Risk Score and Plan: Ondansetron and Dexamethasone  Airway Management Planned: Simple Face Mask  Additional Equipment:   Intra-op Plan:   Post-operative Plan:   Informed Consent: I have reviewed the patients History and Physical, chart, labs and discussed the procedure including the risks, benefits and alternatives for the proposed anesthesia with the patient or authorized representative who has indicated his/her understanding and acceptance.   Dental advisory given  Plan Discussed with: CRNA and Surgeon  Anesthesia Plan Comments:         Anesthesia Quick Evaluation

## 2018-09-03 NOTE — Interval H&P Note (Signed)
History and Physical Interval Note:  09/03/2018 10:10 AM  Ariana Ortega  has presented today for surgery, with the diagnosis of Left hip osteoarthritis  The various methods of treatment have been discussed with the patient and family. After consideration of risks, benefits and other options for treatment, the patient has consented to  Procedure(s) with comments: TOTAL HIP ARTHROPLASTY ANTERIOR APPROACH (Left) - 70 mins as a surgical intervention .  The patient's history has been reviewed, patient examined, no change in status, stable for surgery.  I have reviewed the patient's chart and labs.  Questions were answered to the patient's satisfaction.     Shelda PalMatthew D Harlem Bula

## 2018-09-03 NOTE — Evaluation (Signed)
Physical Therapy Evaluation Patient Details Name: Ariana Ortega MRN: [id number] DOB: 15-Apr-1951 Today's Date: 09/03/2018   History of Present Illness  67 yo female s/p L DA-THA on 09/03/18. PMH includes OA.   Clinical Impression  Pt presents with L hip pain, difficulty performing mobility tasks, and decreased tolerance for ambulation. Pt to benefit from acute PT to address deficits. Pt ambulated 10 ft with RW with min guard assist, requiring min assist for near syncopal episode and slow lowering to chair. PT provided cool washcloth to pt's forehead and neck, and assisted RNs in returning pt to bed after ambulation. PT to progress mobility as tolerated, and will continue to follow acutely.    BP and HR after near syncope: 91/59, 91 bpm    Follow Up Recommendations Follow surgeon's recommendation for DC plan and follow-up therapies;Supervision for mobility/OOB(HEP )    Equipment Recommendations  None recommended by PT    Recommendations for Other Services       Precautions / Restrictions Precautions Precautions: Fall Restrictions Weight Bearing Restrictions: No Other Position/Activity Restrictions: WBAT       Mobility  Bed Mobility Overal bed mobility: Needs Assistance Bed Mobility: Supine to Sit     Supine to sit: HOB elevated;Min assist     General bed mobility comments: Min assist for LLE translation, scooting to EOB.   Transfers Overall transfer level: Needs assistance   Transfers: Sit to/from Stand Sit to Stand: Min assist;From elevated surface         General transfer comment: Min assist for power up, pt with self steadying. Verbal cuing for hand placement.   Ambulation/Gait Ambulation/Gait assistance: Min assist;Min guard Gait Distance (Feet): 10 Feet Assistive device: Rolling walker (2 wheeled) Gait Pattern/deviations: Step-to pattern;Decreased stride length Gait velocity: decr    General Gait Details: Min guard initially for safety, max verbal cuing  for placement in RW as pt pushed walker far out in front of her multiple times. After 10 ft ambulation, pt stating she felt very dizzy and leaned against the doorframe of her room. PT righted pt, called for assist, and RN pulled recliner behind pt and PT assisted pt in lowering to chair. BP and pulse 91/59 and 91 bpm, respectively. PT assisted pt in stand pivot back to bed in case of needing to recline pt for hypotension.   Stairs            Wheelchair Mobility    Modified Rankin (Stroke Patients Only)       Balance Overall balance assessment: Mild deficits observed, not formally tested                                           Pertinent Vitals/Pain Pain Assessment: 0-10 Pain Score: 7  Pain Location: L hip  Pain Descriptors / Indicators: Aching Pain Intervention(s): Limited activity within patient's tolerance;Repositioned;Ice applied;Monitored during session;Premedicated before session    Home Living Family/patient expects to be discharged to:: Private residence Living Arrangements: Other relatives(son ) Available Help at Discharge: Family;Available 24 hours/day(pt's granddaughter will assist her some during the day ) Type of Home: House Home Access: Stairs to enter Entrance Stairs-Rails: None Entrance Stairs-Number of Steps: 2 Home Layout: One level Home Equipment: Walker - 2 wheels;Cane - single point      Prior Function Level of Independence: Independent  Hand Dominance   Dominant Hand: Left    Extremity/Trunk Assessment   Upper Extremity Assessment Upper Extremity Assessment: Overall WFL for tasks assessed    Lower Extremity Assessment Lower Extremity Assessment: Overall WFL for tasks assessed;LLE deficits/detail LLE Deficits / Details: suspected post-surgical hip weakness LLE Sensation: WNL    Cervical / Trunk Assessment Cervical / Trunk Assessment: Normal  Communication   Communication: No difficulties   Cognition Arousal/Alertness: Awake/alert Behavior During Therapy: WFL for tasks assessed/performed Overall Cognitive Status: Within Functional Limits for tasks assessed                                        General Comments      Exercises Total Joint Exercises Ankle Circles/Pumps: AROM;Both;5 reps;Seated   Assessment/Plan    PT Assessment Patient needs continued PT services  PT Problem List Decreased strength;Pain;Decreased range of motion;Decreased activity tolerance;Decreased knowledge of use of DME;Decreased balance;Decreased safety awareness;Decreased mobility       PT Treatment Interventions DME instruction;Therapeutic activities;Gait training;Therapeutic exercise;Patient/family education;Stair training;Functional mobility training;Balance training    PT Goals (Current goals can be found in the Care Plan section)  Acute Rehab PT Goals Patient Stated Goal: none stated  PT Goal Formulation: With patient Time For Goal Achievement: 09/10/18 Potential to Achieve Goals: Good    Frequency 7X/week   Barriers to discharge        Co-evaluation               AM-PAC PT "6 Clicks" Mobility  Outcome Measure Help needed turning from your back to your side while in a flat bed without using bedrails?: A Little Help needed moving from lying on your back to sitting on the side of a flat bed without using bedrails?: A Little Help needed moving to and from a bed to a chair (including a wheelchair)?: A Little Help needed standing up from a chair using your arms (e.g., wheelchair or bedside chair)?: A Little Help needed to walk in hospital room?: A Little Help needed climbing 3-5 steps with a railing? : A Little 6 Click Score: 18    End of Session Equipment Utilized During Treatment: Gait belt Activity Tolerance: Other (comment)(near syncope ) Patient left: in bed;with bed alarm set;with call bell/phone within reach;with SCD's reapplied Nurse Communication:  Mobility status PT Visit Diagnosis: Other abnormalities of gait and mobility (R26.89);Difficulty in walking, not elsewhere classified (R26.2)    Time: [id number] PT Time Calculation (min) (ACUTE ONLY): 26 min   Charges:   PT Evaluation $PT Eval Low Complexity: 1 Low PT Treatments $Gait Training: 8-22 mins        Nicola PoliceAlexa D Rishawn Walck, PT Acute Rehabilitation Services Pager [phone]  Office [phone]  Jarrett Albor D Despina Hiddenure 09/03/2018, 7:31 PM

## 2018-09-03 NOTE — Plan of Care (Signed)
Pt stable with no needs. No changes needed to care plans. Pt pain tolerable at this point, requiring very little pain meds. No s/s of pain or distress. Will continue to monitor.

## 2018-09-03 NOTE — Anesthesia Procedure Notes (Signed)
Spinal  Patient location during procedure: OR Start time: 09/03/2018 11:20 AM End time: 09/03/2018 11:27 AM Staffing Anesthesiologist: Myrtie Soman, MD Performed: anesthesiologist  Preanesthetic Checklist Completed: patient identified, site marked, surgical consent, pre-op evaluation, timeout performed, IV checked, risks and benefits discussed and monitors and equipment checked Spinal Block Patient position: sitting Prep: ChloraPrep Patient monitoring: heart rate, continuous pulse ox and blood pressure Approach: midline Location: L3-4 Injection technique: single-shot Needle Needle type: Sprotte  Needle gauge: 24 G Needle length: 9 cm Additional Notes Expiration date of kit checked and confirmed. Patient tolerated procedure well, without complications.

## 2018-09-03 NOTE — Anesthesia Procedure Notes (Signed)
Date/Time: 09/03/2018 11:23 AM Performed by: Florene Routeeardon, Tillman Kazmierski L, CRNA Oxygen Delivery Method: Simple face mask

## 2018-09-03 NOTE — Discharge Instructions (Signed)

## 2018-09-03 NOTE — Op Note (Signed)
NAME:  Ariana Ortega                ACCOUNT NO.: [id number]      MEDICAL RECORD NO.: [id number]      FACILITY:  [facility]      PHYSICIAN:  Shelda PalMatthew D Makayleigh Poliquin  DATE OF BIRTH:  03-Jun-1951     DATE OF PROCEDURE:  09/03/2018                                 OPERATIVE REPORT         PREOPERATIVE DIAGNOSIS: Left  hip osteoarthritis.      POSTOPERATIVE DIAGNOSIS:  Left hip osteoarthritis.      PROCEDURE:  Left total hip replacement through an anterior approach   utilizing DePuy THR system, component size 50 mm pinnacle cup, a size 32+4 neutral   Altrex liner, a size 2 Hi Acti3 Tri Lock stem with a 32+1 delta ceramic   ball.      SURGEON:  Madlyn FrankelMatthew D. Charlann Boxerlin, M.D.      ASSISTANT:  Skip MayerBlair Roberts, PA-C     ANESTHESIA:  Spinal.      SPECIMENS:  None.      COMPLICATIONS:  None.      BLOOD LOSS:  350 cc     DRAINS:  None.      INDICATION OF THE PROCEDURE:  Ariana Ortega is a 67 y.o. female who had   presented to office for evaluation of left hip pain.  Radiographs revealed   progressive degenerative changes with bone-on-bone   articulation of the  hip joint, including subchondral cystic changes and osteophytes.  The patient had painful limited range of   motion significantly affecting their overall quality of life and function.  The patient was failing to    respond to conservative measures including medications and/or injections and activity modification and at this point was ready   to proceed with more definitive measures.  Consent was obtained for   benefit of pain relief.  Specific risks of infection, DVT, component   failure, dislocation, neurovascular injury, and need for revision surgery were reviewed in the office as well discussion of   the anterior versus posterior approach were reviewed.     PROCEDURE IN DETAIL:  The patient was brought to operative theater.   Once adequate anesthesia, preoperative antibiotics, 2 gm of Ancef, 1 gm of Tranexamic Acid, and  10 mg of Decadron were administered, the patient was positioned supine on the [employer]anna table.  Once the patient was safely positioned with adequate padding of boney prominences we predraped out the hip, and used fluoroscopy to confirm orientation of the pelvis.      The left hip was then prepped and draped from proximal iliac crest to   mid thigh with a shower curtain technique.      Time-out was performed identifying the patient, planned procedure, and the appropriate extremity.     An incision was then made 2 cm lateral to the   anterior superior iliac spine extending over the orientation of the   tensor fascia lata muscle and sharp dissection was carried down to the   fascia of the muscle.      The fascia was then incised.  The muscle belly was identified and swept   laterally and retractor placed along the superior neck.  Following   cauterization of the circumflex vessels and  removing some pericapsular   fat, a second cobra retractor was placed on the inferior neck.  A T-capsulotomy was made along the line of the   superior neck to the trochanteric fossa, then extended proximally and   distally.  Tag sutures were placed and the retractors were then placed   intracapsular.  We then identified the trochanteric fossa and   orientation of my neck cut and then made a neck osteotomy with the femur on traction.  The femoral   head was removed without difficulty or complication.  Traction was let   off and retractors were placed posterior and anterior around the   acetabulum.      The labrum and foveal tissue were debrided.  I began reaming with a 44 mm   reamer and reamed up to 49 mm reamer with good bony bed preparation and a 50 mm  cup was chosen.  The final 50 mm Pinnacle cup was then impacted under fluoroscopy to confirm the depth of penetration and orientation with respect to   Abduction and forward flexion.  A screw was placed into the ilium followed by the hole eliminator.   Per-acetabular osteophytes were removed from the anterior and posterior aspects of the acetabulum.  The final   32+4 neutral Altrex liner was impacted with good visualized rim fit.  The cup was positioned anatomically within the acetabular portion of the pelvis.      At this point, the femur was rolled to 100 degrees.  Further capsule was   released off the inferior aspect of the femoral neck.  I then   released the superior capsule proximally.  With the leg in a neutral position the hook was placed laterally   along the femur under the vastus lateralis origin and elevated manually and then held in position using the hook attachment on the bed.  The leg was then extended and adducted with the leg rolled to 100   degrees of external rotation.  Retractors were placed along the medial calcar and posteriorly over the greater trochanter.  Once the proximal femur was fully   exposed, I used a box osteotome to set orientation.  I then began   broaching with the starting chili pepper broach and passed this by hand and then broached up to 2.  With the 2 broach in place I chose a high offset neck and did several trial reductions.  The offset was appropriate, leg lengths   appeared to be equal best matched with the +1 head ball head ball trial confirmed radiographically.   Given these findings, I went ahead and dislocated the hip, repositioned all   retractors and positioned the right hip in the extended and abducted position.  The final 2 Hi Actis femoral stem was   chosen and it was impacted down to the level of neck cut.  Based on this   and the trial reductions, a final 32+1 delta ceramic ball was chosen and   impacted onto a clean and dry trunnion, and the hip was reduced.  The   hip had been irrigated throughout the case again at this point.  I did   reapproximate the superior capsular leaflet to the anterior leaflet   using #1 Vicryl.  The fascia of the   tensor fascia lata muscle was then  reapproximated using #1 Vicryl and #0 Stratafix sutures.  The   remaining wound was closed with 2-0 Vicryl and running 4-0 Monocryl.   The hip was cleaned, dried,  and dressed sterilely using Dermabond and   Aquacel dressing.  The patient was then brought   to recovery room in stable condition tolerating the procedure well.    Skip Mayer, PA-C was present for the entirety of the case involved from   preoperative positioning, perioperative retractor management, general   facilitation of the case, as well as primary wound closure as assistant.            Madlyn Frankel Charlann Boxer, M.D.        09/03/2018 12:46 PM

## 2018-09-04 ENCOUNTER — Encounter (HOSPITAL_COMMUNITY): Payer: Self-pay | Admitting: Orthopedic Surgery

## 2018-09-04 DIAGNOSIS — E663 Overweight: Secondary | ICD-10-CM | POA: Diagnosis present

## 2018-09-04 LAB — BASIC METABOLIC PANEL
Anion gap: 9 (ref 5–15)
BUN: 12 mg/dL (ref 8–23)
CO2: 22 mmol/L (ref 22–32)
Calcium: 8.2 mg/dL — ABNORMAL LOW (ref 8.9–10.3)
Chloride: 108 mmol/L (ref 98–111)
Creatinine, Ser: 0.62 mg/dL (ref 0.44–1.00)
GFR calc Af Amer: >60 mL/min (ref >60)
GFR calc non Af Amer: >60 mL/min (ref >60)
GLUCOSE: 173 mg/dL — AB (ref 70–99)
Potassium: 3.9 mmol/L (ref 3.5–5.1)
Sodium: 139 mmol/L (ref 135–145)

## 2018-09-04 LAB — CBC
HCT: 30.5 % — ABNORMAL LOW (ref 36.0–46.0)
Hemoglobin: 9.7 g/dL — ABNORMAL LOW (ref 12.0–15.0)
MCH: 32.1 pg (ref 26.0–34.0)
MCHC: 31.8 g/dL (ref 30.0–36.0)
MCV: 101 fL — AB (ref 80.0–100.0)
Platelets: 212 10*3/uL (ref 150–400)
RBC: 3.02 MIL/uL — ABNORMAL LOW (ref 3.87–5.11)
RDW: 12.4 % (ref 11.5–15.5)
WBC: 11.9 10*3/uL — ABNORMAL HIGH (ref 4.0–10.5)
nRBC: 0 % (ref 0.0–0.2)

## 2018-09-04 NOTE — Progress Notes (Signed)
Physical Therapy Treatment Patient Details Name: Ariana Ortega MRN: [id number] DOB: Jan 21, 1951 Today's Date: 09/04/2018    History of Present Illness 67 yo female s/p L DA-THA on 09/03/18. PMH includes OA.     PT Comments    Patient progressing with ambulation and able to negotiate stairs.  Issued and reviewed in detail HEP and sequencing for daily use.  Patient verbalized understanding and stable for d/c home with family assist.     Follow Up Recommendations  Follow surgeon's recommendation for DC plan and follow-up therapies;Supervision for mobility/OOB     Equipment Recommendations  None recommended by PT    Recommendations for Other Services       Precautions / Restrictions Precautions Precautions: Fall Restrictions Other Position/Activity Restrictions: WBAT     Mobility  Bed Mobility   Bed Mobility: Supine to Sit;Sit to Supine     Supine to sit: Modified independent (Device/Increase time) Sit to supine: Min assist   General bed mobility comments: assist for L LE  Transfers Overall transfer level: Needs assistance Equipment used: None Transfers: Sit to/from Stand Sit to Stand: Supervision         General transfer comment: for safety with cues for walker use; simulated car transfers and discussed tub/shower transfer  Ambulation/Gait Ambulation/Gait assistance: Supervision Gait Distance (Feet): 600 Feet Assistive device: Rolling walker (2 wheeled) Gait Pattern/deviations: Step-through pattern;Decreased stride length;Antalgic     General Gait Details: mild symptoms, but eager to walk whole hallway; reports refused narcotic pain meds due to nausea and dizziness yesterday after mobilizing with meds.     Stairs Stairs: Yes Stairs assistance: Supervision Stair Management: Forwards;No rails Number of Stairs: 2 General stair comments: discussed sequence and pt performed with S for safety without devices   Wheelchair Mobility    Modified Rankin  (Stroke Patients Only)       Balance Overall balance assessment: No apparent balance deficits (not formally assessed)                                          Cognition Arousal/Alertness: Awake/alert Behavior During Therapy: WFL for tasks assessed/performed Overall Cognitive Status: Within Functional Limits for tasks assessed                                        Exercises Total Joint Exercises Ankle Circles/Pumps: AROM;Both;10 reps;Supine Quad Sets: AROM;10 reps;Left;Supine Heel Slides: AAROM;AROM;10 reps;Left;Supine Hip ABduction/ADduction: AROM;5 reps;Standing;Left Long Arc Quad: AROM;Seated;Left;5 reps Marching in Standing: AROM;5 reps;Left;Standing Standing Hip Extension: AROM;5 reps;Standing;Left Other Exercises Other Exercises: standing hamstring curls x 5 with L LE    General Comments General comments (skin integrity, edema, etc.): issued handout for HEP      Pertinent Vitals/Pain Pain Assessment: Faces Pain Score: 8  Faces Pain Scale: Hurts little more Pain Location: L hip sore Pain Descriptors / Indicators: Aching;Sore Pain Intervention(s): Monitored during session;Repositioned    Home Living                      Prior Function            PT Goals (current goals can now be found in the care plan section) Progress towards PT goals: Progressing toward goals    Frequency    7X/week      PT Plan  Current plan remains appropriate    Co-evaluation              AM-PAC PT "6 Clicks" Mobility   Outcome Measure  Help needed turning from your back to your side while in a flat bed without using bedrails?: A Little Help needed moving from lying on your back to sitting on the side of a flat bed without using bedrails?: A Little Help needed moving to and from a bed to a chair (including a wheelchair)?: A Little Help needed standing up from a chair using your arms (e.g., wheelchair or bedside chair)?: A  Little Help needed to walk in hospital room?: A Little Help needed climbing 3-5 steps with a railing? : A Little 6 Click Score: 18    End of Session Equipment Utilized During Treatment: Gait belt Activity Tolerance: Patient tolerated treatment well Patient left: with call bell/phone within reach;Other (comment)(standing in room to gather items for dressing for disharge; RN aware) Nurse Communication: Mobility status PT Visit Diagnosis: Other abnormalities of gait and mobility (R26.89);Difficulty in walking, not elsewhere classified (R26.2)     Time: [id number] PT Time Calculation (min) (ACUTE ONLY): 24 min  Charges:  $Gait Training: 8-22 mins $Therapeutic Exercise: 8-22 mins                     Sheran LawlessCyndi , PT Acute Rehabilitation Services [phone] 09/04/2018    Ariana Ortega  09/04/2018, 2:07 PM

## 2018-09-04 NOTE — Progress Notes (Signed)
Physical Therapy Treatment Patient Details Name: Ariana Ortega MRN: [id number] DOB: [birth year] Today's Date: 09/04/2018    History of Present Illness 67 yo female s/p L DA-THA on 09/03/18. PMH includes OA.     PT Comments    Patient progressing with hallway ambulation and getting up with less assist.  Should be stable for home after pm session for stair training and further HEP instruction.    Follow Up Recommendations  Follow surgeon's recommendation for DC plan and follow-up therapies;Supervision for mobility/OOB(HEP)     Equipment Recommendations  None recommended by PT    Recommendations for Other Services       Precautions / Restrictions Precautions Precautions: Fall Restrictions Weight Bearing Restrictions: No Other Position/Activity Restrictions: WBAT     Mobility  Bed Mobility   Bed Mobility: Supine to Sit;Sit to Supine     Supine to sit: Min guard Sit to supine: Min assist   General bed mobility comments: assist for L LE  Transfers Overall transfer level: Needs assistance Equipment used: None Transfers: Sit to/from Stand Sit to Stand: Min guard         General transfer comment: for safety, not really using walker at times  Ambulation/Gait Ambulation/Gait assistance: Min guard Gait Distance (Feet): 300 Feet Assistive device: Rolling walker (2 wheeled) Gait Pattern/deviations: Step-through pattern;Decreased stride length;Antalgic     General Gait Details: mild symptoms, but eager to walk whole hallway; reports refused narcotic pain meds due to nausea and dizziness yesterday after mobilizing with meds.     Stairs             Wheelchair Mobility    Modified Rankin (Stroke Patients Only)       Balance Overall balance assessment: Mild deficits observed, not formally tested                                          Cognition Arousal/Alertness: Awake/alert Behavior During Therapy: WFL for tasks  assessed/performed Overall Cognitive Status: Within Functional Limits for tasks assessed                                        Exercises Total Joint Exercises Ankle Circles/Pumps: AROM;Both;10 reps;Supine Quad Sets: AROM;10 reps;Left;Supine Heel Slides: AAROM;AROM;10 reps;Left;Supine Hip ABduction/ADduction: AROM;10 reps;Left;Supine;AAROM    General Comments        Pertinent Vitals/Pain Pain Score: 8  Pain Location: L hip with ambulation Pain Descriptors / Indicators: Aching;Sore Pain Intervention(s): Monitored during session;Repositioned    Home Living                      Prior Function            PT Goals (current goals can now be found in the care plan section) Progress towards PT goals: Progressing toward goals    Frequency    7X/week      PT Plan Current plan remains appropriate    Co-evaluation              AM-PAC PT "6 Clicks" Mobility   Outcome Measure  Help needed turning from your back to your side while in a flat bed without using bedrails?: A Little Help needed moving from lying on your back to sitting on the side of a flat bed without using bedrails?:  A Little Help needed moving to and from a bed to a chair (including a wheelchair)?: A Little Help needed standing up from a chair using your arms (e.g., wheelchair or bedside chair)?: A Little Help needed to walk in hospital room?: A Little Help needed climbing 3-5 steps with a railing? : A Little 6 Click Score: 18    End of Session Equipment Utilized During Treatment: Gait belt Activity Tolerance: Patient tolerated treatment well Patient left: in bed;with call bell/phone within reach;with family/visitor present   PT Visit Diagnosis: Other abnormalities of gait and mobility (R26.89);Difficulty in walking, not elsewhere classified (R26.2)     Time: 4098-1191 PT Time Calculation (min) (ACUTE ONLY): 20 min  Charges:  $Gait Training: 8-22 mins                      Sheran Lawless, [place] [facility] [phone] 09/04/2018    Ariana Ortega 09/04/2018, 11:38 AM

## 2018-09-04 NOTE — Progress Notes (Signed)
     Subjective: 1 Day Post-Op Procedure(s) (LRB): LEFT TOTAL HIP ARTHROPLASTY ANTERIOR APPROACH (Left)   Seen by Dr. Charlann Boxerlin.  Patient reports pain as mild, pain controlled. No events throughout the night. Using the bathroom well this morning.  VSS. Ready to be discharged home.    Objective:   VITALS:   Vitals:   09/04/18 0130 09/04/18 0430  BP: 119/74 119/71  Pulse: 85 93  Resp: 15 15  Temp: 98.2 F (36.8 C) 98.1 F (36.7 C)  SpO2: 98% 99%    Dorsiflexion/Plantar flexion intact Incision: dressing C/D/I No cellulitis present Compartment soft  LABS Recent Labs    09/04/18 0316  HGB 9.7*  HCT 30.5*  WBC 11.9*  PLT 212    Recent Labs    09/04/18 0316  NA 139  K 3.9  BUN 12  CREATININE 0.62  GLUCOSE 173*     Assessment/Plan: 1 Day Post-Op Procedure(s) (LRB): LEFT TOTAL HIP ARTHROPLASTY ANTERIOR APPROACH (Left) Foley cath d/c'ed Advance diet Up with therapy D/C IV fluids Discharge home [facility] Follow up with OLIN,Lakethia Coppess D in 2 weeks.  Contact information:  [facility]( [facility]) [address], [address] [place] [id number] [phone]    Overweight (BMI 25-29.9) Estimated body mass index is 29.41 kg/m as calculated from the following:   Height as of this encounter: 5\' 3"  (1.6 m).   Weight as of this encounter: 75.3 kg. Patient also counseled that weight may inhibit the healing process Patient counseled that losing weight will help with future health issues         Anastasio AuerbachMatthew S. Varnell Donate   PAC  09/04/2018, 8:32 AM

## 2018-09-10 NOTE — Discharge Summary (Signed)
Physician Discharge Summary  Patient ID: Ariana Ortega MRN: [id number] DOB/AGE: 1950-11-04 67 y.o.  Admit date: 09/03/2018 Discharge date: 09/04/2018   Procedures:  Procedure(s) (LRB): LEFT TOTAL HIP ARTHROPLASTY ANTERIOR APPROACH (Left)  Attending Physician:  Dr. Durene Romans   Admission Diagnoses:   Left hip primary OA / pain  Discharge Diagnoses:  Principal Problem:   S/P left THA, AA Active Problems:   Overweight (BMI 25.0-29.9)  Past Medical History:  Diagnosis Date  . Arthritis   . Elevated cholesterol     HPI:    Ariana Ortega, 67 y.o. female, has a history of pain and functional disability in the left hip(s) due to arthritis and patient has failed non-surgical conservative treatments for greater than 12 weeks to include NSAID's and/or analgesics and activity modification.  Onset of symptoms was gradual starting years ago with gradually worsening course since that time.The patient noted no past surgery on the left hip(s).  Patient currently rates pain in the left hip at 10 out of 10 with activity. Patient has night pain, worsening of pain with activity and weight bearing, trendelenberg gait, pain that interfers with activities of daily living and pain with passive range of motion. Patient has evidence of periarticular osteophytes and joint space narrowing by imaging studies. This condition presents safety issues increasing the risk of falls.  There is no current active infection.  Risks, benefits and expectations were discussed with the patient.  Risks including but not limited to the risk of anesthesia, blood clots, nerve damage, blood vessel damage, failure of the prosthesis, infection and up to and including death.  Patient understand the risks, benefits and expectations and wishes to proceed with surgery.  PCP: Gaspar Garbe, MD   Discharged Condition: good  Hospital Course:  Patient underwent the above stated procedure on 09/03/2018. Patient tolerated the  procedure well and brought to the recovery room in good condition and subsequently to the floor.  POD #1 BP: 119/71 ; Pulse: 93 ; Temp: 98.1 F (36.7 C) ; Resp: 15 Patient reports pain as mild, pain controlled. No events throughout the night. Using the bathroom well this morning.  VSS. Ready to be discharged home. Dorsiflexion/plantar flexion intact, incision: dressing C/D/I, no cellulitis present and compartment soft.   LABS  Basename    HGB     9.7  HCT     30.5    Discharge Exam: General appearance: alert, cooperative and no distress Extremities: Homans sign is negative, no sign of DVT, no edema, redness or tenderness in the calves or thighs and no ulcers, gangrene or trophic changes  Disposition:  Home with follow up in 2 weeks   Follow-up Information    Durene Romans, MD. Schedule an appointment as soon as possible for a visit in 2 weeks.   Specialty:  Orthopedic Surgery Contact information: [address] [place] [address] [phone]           Discharge Instructions    Call MD / Call 911   Complete by:  As directed    If you experience chest pain or shortness of breath, CALL 911 and be transported to the hospital emergency room.  If you develope a fever above 101 F, pus (white drainage) or increased drainage or redness at the wound, or calf pain, call your surgeon's office.   Change dressing   Complete by:  As directed    Maintain surgical dressing until follow up in the clinic. If the edges start  to pull up, may reinforce with tape. If the dressing is no longer working, may remove and cover with gauze and tape, but must keep the area dry and clean.  Call with any questions or concerns.   Constipation Prevention   Complete by:  As directed    Drink plenty of fluids.  Prune juice may be helpful.  You may use a stool softener, such as Colace (over the counter) 100 mg twice a day.  Use MiraLax (over the counter) for constipation as needed.   Diet -  low sodium heart healthy   Complete by:  As directed    Discharge instructions   Complete by:  As directed    Maintain surgical dressing until follow up in the clinic. If the edges start to pull up, may reinforce with tape. If the dressing is no longer working, may remove and cover with gauze and tape, but must keep the area dry and clean.  Follow up in 2 weeks at [facility]. Call with any questions or concerns.   Increase activity slowly as tolerated   Complete by:  As directed    Weight bearing as tolerated with assist device (walker, cane, etc) as directed, use it as long as suggested by your surgeon or therapist, typically at least 4-6 weeks.   TED hose   Complete by:  As directed    Use stockings (TED hose) for 2 weeks on both leg(s).  You may remove them at night for sleeping.      Allergies as of 09/04/2018      Reactions   Codeine Nausea And Vomiting   Nausea , vomiting, dizziness       Medication List    STOP taking these medications   acetaminophen 500 MG tablet Commonly known as:  TYLENOL   aspirin EC 81 MG tablet Replaced by:  aspirin 81 MG chewable tablet   meloxicam 7.5 MG tablet Commonly known as:  MOBIC     TAKE these medications   aspirin 81 MG chewable tablet Chew 1 tablet (81 mg total) by mouth 2 (two) times daily. Take for 4 weeks, then resume regular dose. Replaces:  aspirin EC 81 MG tablet   docusate sodium 100 MG capsule Commonly known as:  COLACE Take 1 capsule (100 mg total) by mouth 2 (two) times daily.   ferrous sulfate 325 (65 FE) MG tablet Take 1 tablet (325 mg total) by mouth 3 (three) times daily with meals.   HYDROcodone-acetaminophen 7.5-325 MG tablet Commonly known as:  NORCO Take 1-2 tablets by mouth every 4 (four) hours as needed for moderate pain.   HYDROcodone-acetaminophen 7.5-325 MG tablet Commonly known as:  NORCO Take 1-2 tablets by mouth every 4 (four) hours as needed for moderate pain.   methocarbamol 500 MG  tablet Commonly known as:  ROBAXIN Take 1 tablet (500 mg total) by mouth every 6 (six) hours as needed for muscle spasms.   polyethylene glycol packet Commonly known as:  MIRALAX / GLYCOLAX Take 17 g by mouth 2 (two) times daily.   rosuvastatin 10 MG tablet Commonly known as:  CRESTOR Take 10 mg by mouth daily.            Discharge Care Instructions  (From admission, onward)         Start     Ordered   09/04/18 0000  Change dressing    Comments:  Maintain surgical dressing until follow up in the clinic. If the edges start to pull up,  may reinforce with tape. If the dressing is no longer working, may remove and cover with gauze and tape, but must keep the area dry and clean.  Call with any questions or concerns.   09/04/18 [id number]           Signed: Anastasio AuerbachMatthew S. Jann Milkovich   PA-C  09/10/2018, 9:57 AM

## 2018-10-16 DIAGNOSIS — Z471 Aftercare following joint replacement surgery: Secondary | ICD-10-CM | POA: Diagnosis not present

## 2018-10-16 DIAGNOSIS — Z96642 Presence of left artificial hip joint: Secondary | ICD-10-CM | POA: Diagnosis not present

## 2018-10-29 DIAGNOSIS — R82998 Other abnormal findings in urine: Secondary | ICD-10-CM | POA: Diagnosis not present

## 2018-10-29 DIAGNOSIS — E7800 Pure hypercholesterolemia, unspecified: Secondary | ICD-10-CM | POA: Diagnosis not present

## 2018-11-05 DIAGNOSIS — D692 Other nonthrombocytopenic purpura: Secondary | ICD-10-CM | POA: Diagnosis not present

## 2018-11-05 DIAGNOSIS — Z0000 Encounter for general adult medical examination without abnormal findings: Secondary | ICD-10-CM | POA: Diagnosis not present

## 2018-11-05 DIAGNOSIS — Z6829 Body mass index (BMI) 29.0-29.9, adult: Secondary | ICD-10-CM | POA: Diagnosis not present

## 2018-11-05 DIAGNOSIS — M170 Bilateral primary osteoarthritis of knee: Secondary | ICD-10-CM | POA: Diagnosis not present

## 2018-11-05 DIAGNOSIS — Z1331 Encounter for screening for depression: Secondary | ICD-10-CM | POA: Diagnosis not present

## 2018-11-05 DIAGNOSIS — Z23 Encounter for immunization: Secondary | ICD-10-CM | POA: Diagnosis not present

## 2018-11-05 DIAGNOSIS — Z96642 Presence of left artificial hip joint: Secondary | ICD-10-CM | POA: Diagnosis not present

## 2018-11-05 DIAGNOSIS — E7800 Pure hypercholesterolemia, unspecified: Secondary | ICD-10-CM | POA: Diagnosis not present

## 2018-11-05 DIAGNOSIS — E663 Overweight: Secondary | ICD-10-CM | POA: Diagnosis not present

## 2019-01-04 IMAGING — DX DG PORTABLE PELVIS
1 series · 1 of 1 positions shown · non-contrast
Comparison: Intraoperative spot films.

CLINICAL DATA: Status post THA.

EXAM:
PORTABLE PELVIS 1-2 VIEWS

[pelvis ap]
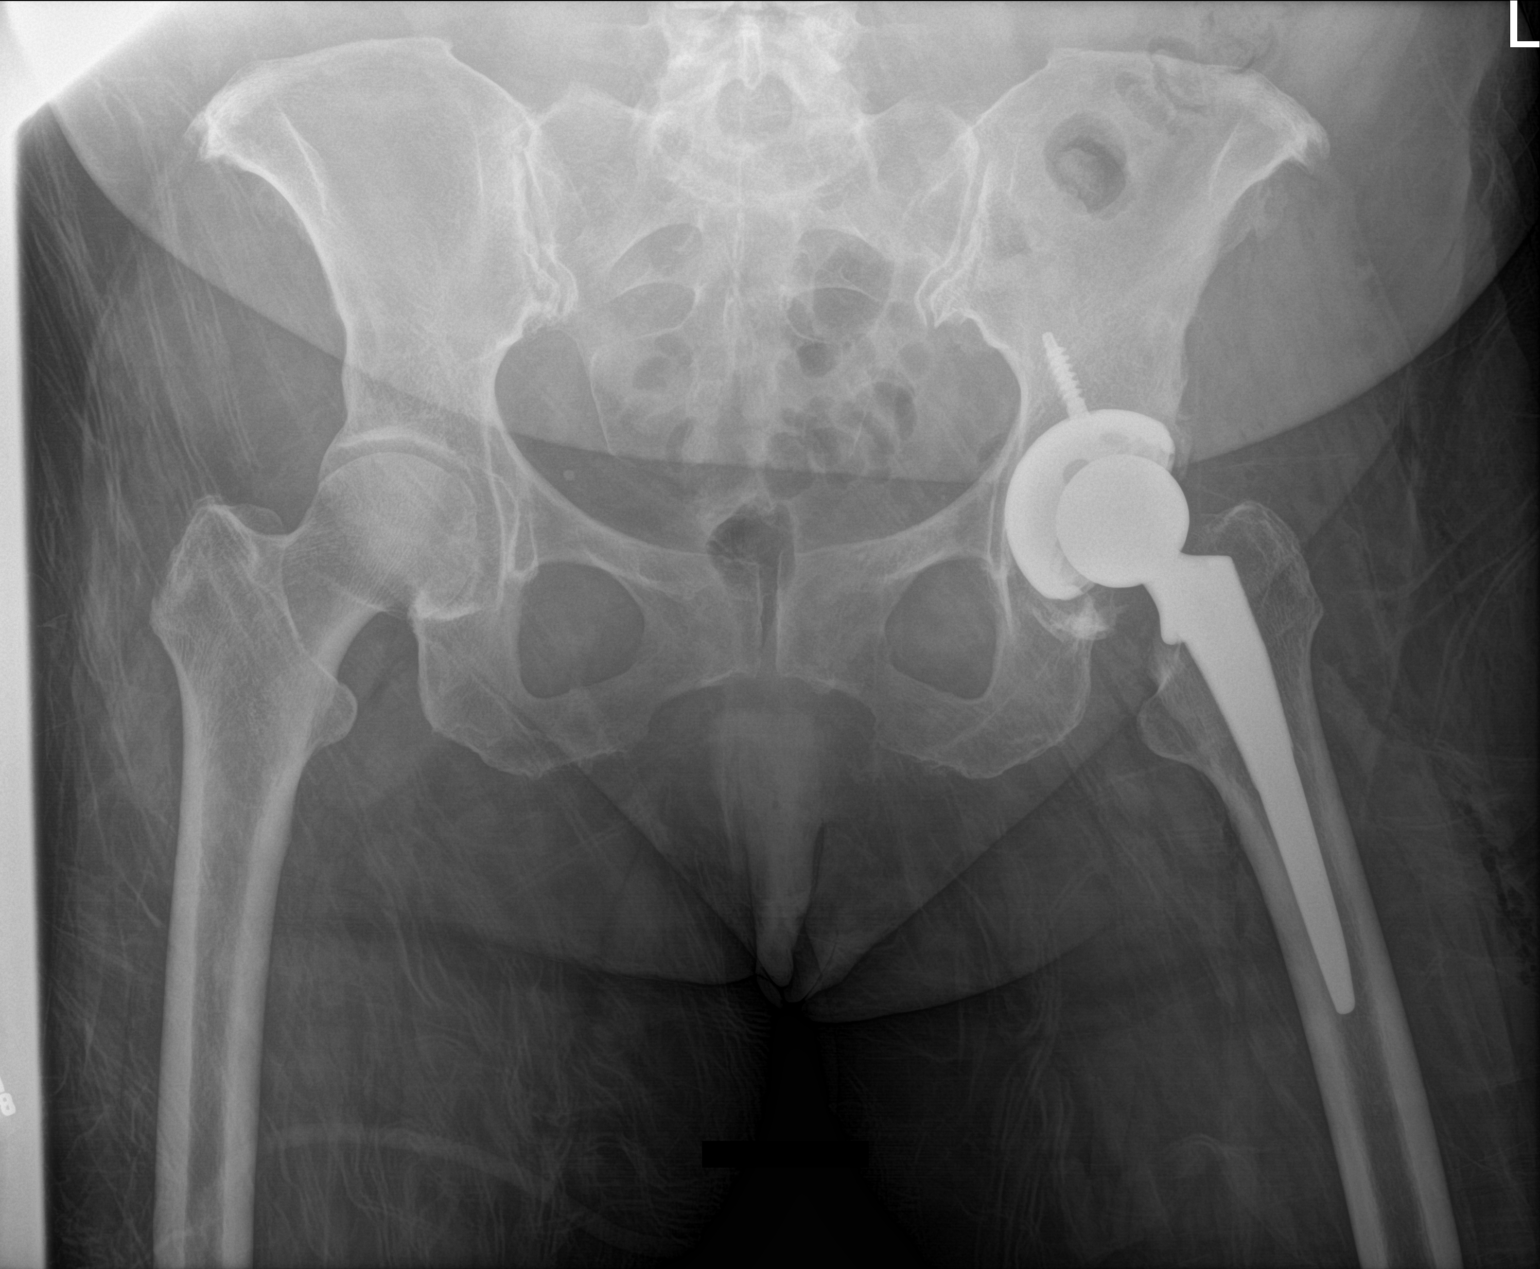

[1 of 1 positions shown; findings below may reference images not displayed]

FINDINGS: LEFT total hip arthroplasty. Satisfactory acetabular and femoral
components.
IMPRESSION: No adverse features.

## 2019-11-04 DIAGNOSIS — E7800 Pure hypercholesterolemia, unspecified: Secondary | ICD-10-CM | POA: Diagnosis not present

## 2019-11-04 DIAGNOSIS — R82998 Other abnormal findings in urine: Secondary | ICD-10-CM | POA: Diagnosis not present

## 2019-11-11 DIAGNOSIS — Z1339 Encounter for screening examination for other mental health and behavioral disorders: Secondary | ICD-10-CM | POA: Diagnosis not present

## 2019-11-11 DIAGNOSIS — Z96642 Presence of left artificial hip joint: Secondary | ICD-10-CM | POA: Diagnosis not present

## 2019-11-11 DIAGNOSIS — M170 Bilateral primary osteoarthritis of knee: Secondary | ICD-10-CM | POA: Diagnosis not present

## 2019-11-11 DIAGNOSIS — Z0000 Encounter for general adult medical examination without abnormal findings: Secondary | ICD-10-CM | POA: Diagnosis not present

## 2019-11-11 DIAGNOSIS — Z1331 Encounter for screening for depression: Secondary | ICD-10-CM | POA: Diagnosis not present

## 2019-11-11 DIAGNOSIS — E663 Overweight: Secondary | ICD-10-CM | POA: Diagnosis not present

## 2019-11-11 DIAGNOSIS — E7800 Pure hypercholesterolemia, unspecified: Secondary | ICD-10-CM | POA: Diagnosis not present

## 2019-11-11 DIAGNOSIS — D692 Other nonthrombocytopenic purpura: Secondary | ICD-10-CM | POA: Diagnosis not present

## 2019-11-20 ENCOUNTER — Other Ambulatory Visit: Payer: Self-pay | Admitting: Internal Medicine

## 2019-11-20 DIAGNOSIS — Z1231 Encounter for screening mammogram for malignant neoplasm of breast: Secondary | ICD-10-CM

## 2019-12-26 DIAGNOSIS — Z1212 Encounter for screening for malignant neoplasm of rectum: Secondary | ICD-10-CM | POA: Diagnosis not present

## 2020-10-07 DIAGNOSIS — H8110 Benign paroxysmal vertigo, unspecified ear: Secondary | ICD-10-CM | POA: Diagnosis not present

## 2020-11-23 DIAGNOSIS — E7800 Pure hypercholesterolemia, unspecified: Secondary | ICD-10-CM | POA: Diagnosis not present

## 2020-11-24 DIAGNOSIS — E663 Overweight: Secondary | ICD-10-CM | POA: Diagnosis not present

## 2020-11-24 DIAGNOSIS — E7800 Pure hypercholesterolemia, unspecified: Secondary | ICD-10-CM | POA: Diagnosis not present

## 2020-11-24 DIAGNOSIS — D692 Other nonthrombocytopenic purpura: Secondary | ICD-10-CM | POA: Diagnosis not present

## 2020-11-24 DIAGNOSIS — Z1212 Encounter for screening for malignant neoplasm of rectum: Secondary | ICD-10-CM | POA: Diagnosis not present

## 2020-11-24 DIAGNOSIS — Z0000 Encounter for general adult medical examination without abnormal findings: Secondary | ICD-10-CM | POA: Diagnosis not present

## 2020-11-24 DIAGNOSIS — M170 Bilateral primary osteoarthritis of knee: Secondary | ICD-10-CM | POA: Diagnosis not present

## 2020-11-24 DIAGNOSIS — Z96642 Presence of left artificial hip joint: Secondary | ICD-10-CM | POA: Diagnosis not present

## 2020-11-24 DIAGNOSIS — R82998 Other abnormal findings in urine: Secondary | ICD-10-CM | POA: Diagnosis not present

## 2020-11-24 DIAGNOSIS — H8110 Benign paroxysmal vertigo, unspecified ear: Secondary | ICD-10-CM | POA: Diagnosis not present

## 2020-11-24 DIAGNOSIS — Z23 Encounter for immunization: Secondary | ICD-10-CM | POA: Diagnosis not present

## 2021-11-03 DIAGNOSIS — E7800 Pure hypercholesterolemia, unspecified: Secondary | ICD-10-CM | POA: Diagnosis not present

## 2021-11-03 DIAGNOSIS — Z79899 Other long term (current) drug therapy: Secondary | ICD-10-CM | POA: Diagnosis not present

## 2021-12-01 DIAGNOSIS — M170 Bilateral primary osteoarthritis of knee: Secondary | ICD-10-CM | POA: Diagnosis not present

## 2021-12-01 DIAGNOSIS — Z1331 Encounter for screening for depression: Secondary | ICD-10-CM | POA: Diagnosis not present

## 2021-12-01 DIAGNOSIS — H8110 Benign paroxysmal vertigo, unspecified ear: Secondary | ICD-10-CM | POA: Diagnosis not present

## 2021-12-01 DIAGNOSIS — Z1339 Encounter for screening examination for other mental health and behavioral disorders: Secondary | ICD-10-CM | POA: Diagnosis not present

## 2021-12-01 DIAGNOSIS — Z0000 Encounter for general adult medical examination without abnormal findings: Secondary | ICD-10-CM | POA: Diagnosis not present

## 2021-12-01 DIAGNOSIS — E7800 Pure hypercholesterolemia, unspecified: Secondary | ICD-10-CM | POA: Diagnosis not present

## 2021-12-01 DIAGNOSIS — Z96642 Presence of left artificial hip joint: Secondary | ICD-10-CM | POA: Diagnosis not present

## 2021-12-01 DIAGNOSIS — D692 Other nonthrombocytopenic purpura: Secondary | ICD-10-CM | POA: Diagnosis not present

## 2021-12-01 DIAGNOSIS — E663 Overweight: Secondary | ICD-10-CM | POA: Diagnosis not present

## 2021-12-08 DIAGNOSIS — R82998 Other abnormal findings in urine: Secondary | ICD-10-CM | POA: Diagnosis not present

## 2021-12-08 DIAGNOSIS — Z23 Encounter for immunization: Secondary | ICD-10-CM | POA: Diagnosis not present

## 2021-12-08 DIAGNOSIS — Z1212 Encounter for screening for malignant neoplasm of rectum: Secondary | ICD-10-CM | POA: Diagnosis not present

## 2022-12-06 DIAGNOSIS — E7800 Pure hypercholesterolemia, unspecified: Secondary | ICD-10-CM | POA: Diagnosis not present

## 2022-12-06 DIAGNOSIS — Z1212 Encounter for screening for malignant neoplasm of rectum: Secondary | ICD-10-CM | POA: Diagnosis not present

## 2022-12-06 DIAGNOSIS — R7989 Other specified abnormal findings of blood chemistry: Secondary | ICD-10-CM | POA: Diagnosis not present

## 2022-12-08 DIAGNOSIS — R82998 Other abnormal findings in urine: Secondary | ICD-10-CM | POA: Diagnosis not present

## 2022-12-13 ENCOUNTER — Other Ambulatory Visit: Payer: Self-pay | Admitting: Internal Medicine

## 2022-12-13 DIAGNOSIS — Z0000 Encounter for general adult medical examination without abnormal findings: Secondary | ICD-10-CM | POA: Diagnosis not present

## 2022-12-13 DIAGNOSIS — Z23 Encounter for immunization: Secondary | ICD-10-CM | POA: Diagnosis not present

## 2022-12-13 DIAGNOSIS — E669 Obesity, unspecified: Secondary | ICD-10-CM | POA: Diagnosis not present

## 2022-12-13 DIAGNOSIS — H8110 Benign paroxysmal vertigo, unspecified ear: Secondary | ICD-10-CM | POA: Diagnosis not present

## 2022-12-13 DIAGNOSIS — Z96642 Presence of left artificial hip joint: Secondary | ICD-10-CM | POA: Diagnosis not present

## 2022-12-13 DIAGNOSIS — M170 Bilateral primary osteoarthritis of knee: Secondary | ICD-10-CM | POA: Diagnosis not present

## 2022-12-13 DIAGNOSIS — Z1331 Encounter for screening for depression: Secondary | ICD-10-CM | POA: Diagnosis not present

## 2022-12-13 DIAGNOSIS — Z1339 Encounter for screening examination for other mental health and behavioral disorders: Secondary | ICD-10-CM | POA: Diagnosis not present

## 2022-12-13 DIAGNOSIS — E7800 Pure hypercholesterolemia, unspecified: Secondary | ICD-10-CM | POA: Diagnosis not present

## 2023-02-06 ENCOUNTER — Ambulatory Visit
Admission: RE | Admit: 2023-02-06 | Discharge: 2023-02-06 | Disposition: A | Discharge status: Home / Self Care | Payer: PPO | Source: Ambulatory Visit | Attending: Internal Medicine | Admitting: Internal Medicine

## 2023-02-06 DIAGNOSIS — Z0000 Encounter for general adult medical examination without abnormal findings: Secondary | ICD-10-CM

## 2023-02-06 DIAGNOSIS — Z1231 Encounter for screening mammogram for malignant neoplasm of breast: Secondary | ICD-10-CM | POA: Diagnosis not present

## 2023-03-12 ENCOUNTER — Other Ambulatory Visit (HOSPITAL_COMMUNITY): Payer: Self-pay | Admitting: Registered Nurse

## 2023-03-12 ENCOUNTER — Other Ambulatory Visit: Payer: Self-pay

## 2023-03-12 ENCOUNTER — Ambulatory Visit (HOSPITAL_COMMUNITY)
Admission: RE | Admit: 2023-03-12 | Discharge: 2023-03-12 | Disposition: A | Discharge status: Home / Self Care | Payer: PPO | Source: Ambulatory Visit | Attending: Internal Medicine | Admitting: Internal Medicine

## 2023-03-12 ENCOUNTER — Encounter (HOSPITAL_COMMUNITY): Payer: Self-pay | Admitting: Registered Nurse

## 2023-03-12 DIAGNOSIS — H538 Other visual disturbances: Secondary | ICD-10-CM

## 2023-03-12 DIAGNOSIS — H53453 Other localized visual field defect, bilateral: Secondary | ICD-10-CM

## 2023-03-12 DIAGNOSIS — H532 Diplopia: Secondary | ICD-10-CM | POA: Diagnosis not present

## 2023-03-12 DIAGNOSIS — H8110 Benign paroxysmal vertigo, unspecified ear: Secondary | ICD-10-CM | POA: Diagnosis not present

## 2023-03-12 DIAGNOSIS — K118 Other diseases of salivary glands: Secondary | ICD-10-CM | POA: Diagnosis not present

## 2023-03-12 DIAGNOSIS — E7800 Pure hypercholesterolemia, unspecified: Secondary | ICD-10-CM | POA: Diagnosis not present

## 2023-03-12 DIAGNOSIS — E669 Obesity, unspecified: Secondary | ICD-10-CM | POA: Diagnosis not present

## 2023-03-12 DIAGNOSIS — D692 Other nonthrombocytopenic purpura: Secondary | ICD-10-CM | POA: Diagnosis not present

## 2023-03-12 DIAGNOSIS — G319 Degenerative disease of nervous system, unspecified: Secondary | ICD-10-CM | POA: Diagnosis not present

## 2023-03-12 MED ORDER — GADOBUTROL 1 MMOL/ML IV SOLN
7.5000 mL | Freq: Once | INTRAVENOUS | Status: AC | PRN
  Administered 2023-03-12: 7.5 mL via INTRAVENOUS

## 2023-03-13 ENCOUNTER — Ambulatory Visit (HOSPITAL_COMMUNITY): Admission: RE | Admit: 2023-03-13 | Payer: PPO | Source: Ambulatory Visit

## 2023-03-13 ENCOUNTER — Encounter (HOSPITAL_COMMUNITY): Payer: Self-pay

## 2023-03-27 DIAGNOSIS — H04123 Dry eye syndrome of bilateral lacrimal glands: Secondary | ICD-10-CM | POA: Diagnosis not present

## 2023-03-27 DIAGNOSIS — H25813 Combined forms of age-related cataract, bilateral: Secondary | ICD-10-CM | POA: Diagnosis not present

## 2023-03-27 DIAGNOSIS — H538 Other visual disturbances: Secondary | ICD-10-CM | POA: Diagnosis not present

## 2023-03-27 DIAGNOSIS — H35363 Drusen (degenerative) of macula, bilateral: Secondary | ICD-10-CM | POA: Diagnosis not present

## 2023-05-24 DIAGNOSIS — H25811 Combined forms of age-related cataract, right eye: Secondary | ICD-10-CM | POA: Diagnosis not present

## 2023-05-24 DIAGNOSIS — H524 Presbyopia: Secondary | ICD-10-CM | POA: Diagnosis not present

## 2023-05-24 DIAGNOSIS — H25813 Combined forms of age-related cataract, bilateral: Secondary | ICD-10-CM | POA: Diagnosis not present

## 2023-09-13 DIAGNOSIS — H2513 Age-related nuclear cataract, bilateral: Secondary | ICD-10-CM | POA: Diagnosis not present

## 2023-09-13 DIAGNOSIS — H2511 Age-related nuclear cataract, right eye: Secondary | ICD-10-CM | POA: Diagnosis not present

## 2023-09-13 DIAGNOSIS — H40013 Open angle with borderline findings, low risk, bilateral: Secondary | ICD-10-CM | POA: Diagnosis not present

## 2023-09-13 DIAGNOSIS — H27113 Subluxation of lens, bilateral: Secondary | ICD-10-CM | POA: Diagnosis not present

## 2023-09-13 DIAGNOSIS — H25043 Posterior subcapsular polar age-related cataract, bilateral: Secondary | ICD-10-CM | POA: Diagnosis not present

## 2023-11-14 DIAGNOSIS — H2511 Age-related nuclear cataract, right eye: Secondary | ICD-10-CM | POA: Diagnosis not present

## 2023-11-14 DIAGNOSIS — H2513 Age-related nuclear cataract, bilateral: Secondary | ICD-10-CM | POA: Diagnosis not present

## 2023-11-14 DIAGNOSIS — H2189 Other specified disorders of iris and ciliary body: Secondary | ICD-10-CM | POA: Diagnosis not present

## 2023-11-15 DIAGNOSIS — H2512 Age-related nuclear cataract, left eye: Secondary | ICD-10-CM | POA: Diagnosis not present

## 2023-11-28 DIAGNOSIS — H2189 Other specified disorders of iris and ciliary body: Secondary | ICD-10-CM | POA: Diagnosis not present

## 2023-11-28 DIAGNOSIS — H2512 Age-related nuclear cataract, left eye: Secondary | ICD-10-CM | POA: Diagnosis not present

## 2023-11-28 DIAGNOSIS — H2513 Age-related nuclear cataract, bilateral: Secondary | ICD-10-CM | POA: Diagnosis not present

## 2023-12-12 DIAGNOSIS — E7800 Pure hypercholesterolemia, unspecified: Secondary | ICD-10-CM | POA: Diagnosis not present

## 2023-12-12 DIAGNOSIS — Z1212 Encounter for screening for malignant neoplasm of rectum: Secondary | ICD-10-CM | POA: Diagnosis not present

## 2023-12-17 DIAGNOSIS — R82998 Other abnormal findings in urine: Secondary | ICD-10-CM | POA: Diagnosis not present

## 2023-12-19 DIAGNOSIS — H8110 Benign paroxysmal vertigo, unspecified ear: Secondary | ICD-10-CM | POA: Diagnosis not present

## 2023-12-19 DIAGNOSIS — Z0000 Encounter for general adult medical examination without abnormal findings: Secondary | ICD-10-CM | POA: Diagnosis not present

## 2023-12-19 DIAGNOSIS — E669 Obesity, unspecified: Secondary | ICD-10-CM | POA: Diagnosis not present

## 2023-12-19 DIAGNOSIS — D692 Other nonthrombocytopenic purpura: Secondary | ICD-10-CM | POA: Diagnosis not present

## 2023-12-19 DIAGNOSIS — Z96642 Presence of left artificial hip joint: Secondary | ICD-10-CM | POA: Diagnosis not present

## 2023-12-19 DIAGNOSIS — M170 Bilateral primary osteoarthritis of knee: Secondary | ICD-10-CM | POA: Diagnosis not present

## 2023-12-19 DIAGNOSIS — E7800 Pure hypercholesterolemia, unspecified: Secondary | ICD-10-CM | POA: Diagnosis not present

## 2023-12-19 DIAGNOSIS — Z1339 Encounter for screening examination for other mental health and behavioral disorders: Secondary | ICD-10-CM | POA: Diagnosis not present

## 2023-12-19 DIAGNOSIS — Z1331 Encounter for screening for depression: Secondary | ICD-10-CM | POA: Diagnosis not present

## 2024-03-19 ENCOUNTER — Other Ambulatory Visit: Payer: Self-pay | Admitting: Internal Medicine

## 2024-03-19 DIAGNOSIS — Z1231 Encounter for screening mammogram for malignant neoplasm of breast: Secondary | ICD-10-CM

## 2024-04-03 ENCOUNTER — Ambulatory Visit
Admission: RE | Admit: 2024-04-03 | Discharge: 2024-04-03 | Disposition: A | Discharge status: Home / Self Care | Source: Ambulatory Visit | Attending: Internal Medicine | Admitting: Internal Medicine

## 2024-04-03 DIAGNOSIS — Z1231 Encounter for screening mammogram for malignant neoplasm of breast: Secondary | ICD-10-CM | POA: Diagnosis not present

## 2024-06-03 DIAGNOSIS — L0231 Cutaneous abscess of buttock: Secondary | ICD-10-CM | POA: Diagnosis not present

## 2024-10-18 ENCOUNTER — Emergency Department (HOSPITAL_COMMUNITY)
Admission: EM | Admit: 2024-10-18 | Discharge: 2024-10-18 | Disposition: A | Discharge status: Home / Self Care | Attending: Emergency Medicine | Admitting: Emergency Medicine

## 2024-10-18 ENCOUNTER — Emergency Department (HOSPITAL_COMMUNITY)

## 2024-10-18 DIAGNOSIS — M5431 Sciatica, right side: Secondary | ICD-10-CM

## 2024-10-18 DIAGNOSIS — M545 Low back pain, unspecified: Secondary | ICD-10-CM | POA: Diagnosis present

## 2024-10-18 DIAGNOSIS — R000 Tachycardia, unspecified: Secondary | ICD-10-CM | POA: Diagnosis not present

## 2024-10-18 DIAGNOSIS — M5441 Lumbago with sciatica, right side: Secondary | ICD-10-CM | POA: Diagnosis not present

## 2024-10-18 DIAGNOSIS — M25551 Pain in right hip: Secondary | ICD-10-CM | POA: Insufficient documentation

## 2024-10-18 MED ORDER — OXYCODONE-ACETAMINOPHEN 5-325 MG PO TABS
1.0000 | ORAL_TABLET | Freq: Once | ORAL | Status: AC
  Administered 2024-10-18: 1 via ORAL
  Filled 2024-10-18: qty 1

## 2024-10-18 MED ORDER — KETOROLAC TROMETHAMINE 15 MG/ML IJ SOLN
15.0000 mg | Freq: Once | INTRAMUSCULAR | Status: AC
  Administered 2024-10-18: 15 mg via INTRAMUSCULAR
  Filled 2024-10-18: qty 1

## 2024-10-18 MED ORDER — METHOCARBAMOL 500 MG PO TABS: 500.0000 mg | ORAL_TABLET | Freq: Two times a day (BID) | ORAL | 0 refills | Status: AC

## 2024-10-18 MED ORDER — OXYCODONE-ACETAMINOPHEN 5-325 MG PO TABS: 1.0000 | ORAL_TABLET | Freq: Four times a day (QID) | ORAL | 0 refills | Status: AC | PRN

## 2024-10-18 NOTE — Discharge Instructions (Addendum)
 Please use Tylenol  or ibuprofen for pain.  You may use 600 mg ibuprofen every 6 hours or 1000 mg of Tylenol  every 6 hours.  You may choose to alternate between the 2.  This would be most effective.  Not to exceed 4 g of Tylenol  within 24 hours.  Not to exceed 3200 mg ibuprofen 24 hours.  You can use the muscle relaxant I am prescribing in addition to the above to help with any breakthrough pain.  You can take it up to twice daily.  It is safe to take at night, but I would be cautious taking it during the day as it can cause some drowsiness.  Make sure that you are feeling awake and alert before you get behind the wheel of a car or operate a motor vehicle.  It is not a narcotic pain medication so you are able to take it if it is not making you drowsy and still pilot a vehicle or machinery safely.  You can use the stronger narcotic pain medication in place of Tylenol  for severe break through pain.  If you take the narcotic pain medication that we prescribed recommend that you also take a laxative such as MiraLAX or Dulcolax every day that you take the narcotic pain medicine, and drink plenty of fluids, 50 to 64 ounces to prevent any constipation.

## 2024-10-18 NOTE — ED Triage Notes (Signed)
 Patient c/o right leg pain from hip to knee Sudden onset 2 days ago Denies injury  Pain 10/10

## 2024-10-18 NOTE — ED Provider Notes (Signed)
 " [place] [facility] AT [facility] Provider Note   CSN: [id number] Arrival date & time: 10/18/24  9284     Patient presents with: Leg Pain   Ariana Ortega is a 74 y.o. female with past medical history significant for arthritis, hyperlipidemia presents concern for right hip pain/right leg pain.  Patient reports no fall, injury, started having shooting pain down right leg along right hip, back of leg, into knee.  No improvement with Tylenol .  No previous history of similar.  No leg swelling, no recent travel.    Leg Pain      Prior to Admission medications  Medication Sig Start Date End Date Taking? Authorizing Provider  methocarbamol  (ROBAXIN ) 500 MG tablet Take 1 tablet (500 mg total) by mouth 2 (two) times daily. 10/18/24  Yes Davyon Fisch H, PA-C  oxyCODONE -acetaminophen  (PERCOCET/ROXICET) 5-325 MG tablet Take 1 tablet by mouth every 6 (six) hours as needed for severe pain (pain score 7-10). 10/18/24  Yes Nell Gales H, PA-C  docusate sodium  (COLACE) 100 MG capsule Take 1 capsule (100 mg total) by mouth 2 (two) times daily. 09/03/18   Danella Cough, PA-C  ferrous sulfate  (FERROUSUL) 325 (65 FE) MG tablet Take 1 tablet (325 mg total) by mouth 3 (three) times daily with meals. 09/03/18   Danella Cough, PA-C  HYDROcodone -acetaminophen  (NORCO) 7.5-325 MG tablet Take 1-2 tablets by mouth every 4 (four) hours as needed for moderate pain. 09/03/18   Danella Cough, PA-C  HYDROcodone -acetaminophen  (NORCO) 7.5-325 MG tablet Take 1-2 tablets by mouth every 4 (four) hours as needed for moderate pain. 09/08/18   Danella Cough, PA-C  polyethylene glycol (MIRALAX  / GLYCOLAX ) packet Take 17 g by mouth 2 (two) times daily. 09/03/18   Danella Cough, PA-C  rosuvastatin  (CRESTOR ) 10 MG tablet Take 10 mg by mouth daily.    [provider]    Allergies: Codeine    Review of Systems  All other systems reviewed and are negative.   Updated Vital  Signs BP (!) 180/97 (BP Location: Left Arm)   Pulse (!) 110   Temp 97.6 F (36.4 C) (Oral)   Resp 19   Ht 5' 2 (1.575 m)   Wt 74.8 kg   SpO2 100%   BMI 30.18 kg/m   Physical Exam Vitals and nursing note reviewed.  Constitutional:      General: She is not in acute distress.    Appearance: Normal appearance.  HENT:     Head: Normocephalic and atraumatic.  Eyes:     General:        Right eye: No discharge.        Left eye: No discharge.  Cardiovascular:     Rate and Rhythm: Regular rhythm. Tachycardia present.     Pulses: Normal pulses.     Heart sounds: No murmur heard.    No friction rub. No gallop.  Pulmonary:     Effort: Pulmonary effort is normal.     Breath sounds: Normal breath sounds.  Abdominal:     General: Bowel sounds are normal.     Palpations: Abdomen is soft.  Musculoskeletal:     Comments: Patient with some right lumbar paraspinous muscle tenderness.  No significant midline spinal tenderness.  Positive straight leg raise on right.  Some tenderness to palpation over the right greater trochanter.  Intact strength to flexion, extension at the right hip, right knee.  Skin:    General: Skin is warm and dry.  Capillary Refill: Capillary refill takes less than 2 seconds.  Neurological:     Mental Status: She is alert and oriented to person, place, and time.  Psychiatric:        Mood and Affect: Mood normal.        Behavior: Behavior normal.     (all labs ordered are listed, but only abnormal results are displayed) Labs Reviewed - No data to display  EKG: None  Radiology: DG Lumbar Spine Complete Result Date: 10/18/2024 EXAM: 4 VIEW(S) XRAY OF THE LUMBAR SPINE 10/18/2024 08:11:29 AM COMPARISON: None available. CLINICAL HISTORY: pain FINDINGS: LUMBAR SPINE: BONES: Vertebral body heights are maintained. Slight curvature of the lower thoracic and upper lumbar spine is convex towards the right. First-degree Anterolisthesis of L5 on S1. No sign of acute  fracture or subluxation. Previous left hip arthroplasty. DISCS AND DEGENERATIVE CHANGES: Multilevel endplate degenerative changes identified. Disc space narrowing moderate to severe disc space narrowing is identified at the L1-L2, L3-L4, L4-L5, and L5-S1 levels. SOFT TISSUES: No acute abnormality. IMPRESSION: 1. No acute findings. 2. Moderate to severe disc space narrowing at L1-2, L3-4, L4-5, and L5-S1 levels. 3. First-degree anterolisthesis of L5 on S1. Electronically signed by: Waddell Calk MD 10/18/2024 08:34 AM EST RP Workstation: [id number]   DG Hip Unilat W or Wo Pelvis 2-3 Views Right Result Date: 10/18/2024 EXAM: 2 or 3 VIEW(S) XRAY OF THE UNILATERAL HIP 10/18/2024 08:11:29 AM COMPARISON: 09/03/2018 CLINICAL HISTORY: pain FINDINGS: BONES AND JOINTS: Left total hip arthroplasty in place. No signs of acute fracture or dislocation. No significant arthropathy involving the right hip. SOFT TISSUES: Unremarkable. IMPRESSION: 1. No acute findings. 2. Left total hip arthroplasty in place. Electronically signed by: Waddell Calk MD 10/18/2024 08:24 AM EST RP Workstation: [id number]     Procedures   Medications Ordered in the ED  ketorolac  (TORADOL ) 15 MG/ML injection 15 mg (15 mg Intramuscular Given 10/18/24 0748)  oxyCODONE -acetaminophen  (PERCOCET/ROXICET) 5-325 MG per tablet 1 tablet (1 tablet Oral Given 10/18/24 0748)                                    Medical Decision Making  Patient with back pain/hip pain.  My emergent differential diagnosis includes slipped disc, compression fracture, spondylolisthesis, less clinical concern for epidural abscess or osteomyelitis based on patient history.  Overall with high clinical suspicion for lumbar sprain, sciatica based on clinical presentation, risk factors.  Pain is lateral, high up in the leg, no leg swelling, no recent travel, no history of blood clots, my clinical suspicion for DVT, PE, other vascular pathology is quite low.  No neurological  deficits. Patient is ambulatory. No warning symptoms of back pain including: fecal incontinence, urinary retention or overflow incontinence, night sweats, waking from sleep with back pain, unexplained fevers or weight loss, h/o cancer, IVDU, recent trauma. Overall low clinical concern for cauda equina, epidural abscess, or other serious cause of back pain.  I independently interpreted plain film radiograph of the right low back, right hip which show  1. No acute findings.  2. Moderate to severe disc space narrowing at L1-2, L3-4, L4-5, and L5-S1  levels.  3. First-degree anterolisthesis of L5 on S1.   1. No acute findings.  2. Left total hip arthroplasty in place.   Conservative measures such as rest, ice/heat, ibuprofen, Tylenol , and  prescription for Robaxin  short course of oxycodone  for severe breakthrough pain indicated with orthopedic follow-up if  no improvement with conservative management.  Extensive return precautions given, patient discharged in stable condition at this time.  Final diagnoses:  Sciatica of right side  Acute right-sided low back pain with right-sided sciatica    ED Discharge Orders          Ordered    methocarbamol  (ROBAXIN ) 500 MG tablet  2 times daily        10/18/24 0905    oxyCODONE -acetaminophen  (PERCOCET/ROXICET) 5-325 MG tablet  Every 6 hours PRN        10/18/24 0905               Deseree Zemaitis, Sherlean DEL, PA-C 10/18/24 0906    Franklyn Sid SAILOR, MD 10/18/24 1018  "
